# Patient Record
Sex: Male | Born: 1970 | Race: White | Hispanic: No | Marital: Married | Smoking: Never smoker
Health system: Southern US, Community
[De-identification: ages and names within clinical notes are randomized; demographics above are authoritative.]

## PROBLEM LIST (undated history)

## (undated) DIAGNOSIS — D126 Benign neoplasm of colon, unspecified: Secondary | ICD-10-CM

## (undated) DIAGNOSIS — G459 Transient cerebral ischemic attack, unspecified: Secondary | ICD-10-CM

## (undated) DIAGNOSIS — K449 Diaphragmatic hernia without obstruction or gangrene: Secondary | ICD-10-CM

## (undated) DIAGNOSIS — E7800 Pure hypercholesterolemia, unspecified: Secondary | ICD-10-CM

## (undated) DIAGNOSIS — R7303 Prediabetes: Secondary | ICD-10-CM

## (undated) DIAGNOSIS — R251 Tremor, unspecified: Secondary | ICD-10-CM

## (undated) HISTORY — DX: Prediabetes: R73.03

## (undated) HISTORY — DX: Transient cerebral ischemic attack, unspecified: G45.9

## (undated) HISTORY — DX: Pure hypercholesterolemia, unspecified: E78.00

## (undated) HISTORY — DX: Benign neoplasm of colon, unspecified: D12.6

## (undated) HISTORY — DX: Diaphragmatic hernia without obstruction or gangrene: K44.9

---

## 1992-11-19 HISTORY — PX: INGUINAL HERNIA REPAIR: SUR1180

## 2008-04-29 ENCOUNTER — Ambulatory Visit: Payer: Self-pay | Admitting: *Deleted

## 2011-04-03 NOTE — Consult Note (Signed)
VASCULAR SURGERY CONSULTATION   Jacobs, Thomas  DOB:  Oct 28, 1971                                       04/29/2008  GNFAO#:[id number]   REFERRING PHYSICIAN:  Roxanne Mins, PA-C   REASON FOR CONSULTATION:  Left upper extremity pain.   HISTORY:  The patient is a 40 year old male who describes a long history  of left upper extremity discomfort.  He dates this back to a left elbow  dislocation which occurred approximately 6 years ago.  He now describes  a heaviness and discomfort throughout his left arm intermittently.  This  is also associated with some clumsiness in his left hand.  Occasional  tingling in his fingers.  Notes an occasional tremor in his left hand.   PAST MEDICAL HISTORY:  Unremarkable.   MEDICATIONS:  None.   ALLERGIES:  None.   FAMILY HISTORY:  Noncontributory.   SOCIAL HISTORY:  The patient is married with two children.  He works as  a Curator.  Does not smoke or use alcohol products.   REVIEW OF SYSTEMS:  Negative per patient encounter form.   PHYSICAL EXAM:  Generally well-appearing 40 year old male.  Alert,  oriented.  No acute distress.  Vital Signs:  BP 101/66, pulse 59 per  minute.  Extremities:  2+ axillary, brachial, radial, and ulnar pulses  bilaterally.  No subclavian bruits.  Equal strength bilaterally.   Mild reduction in left radial pulse with military maneuver and head  turned to the right.   No tenderness over the brachial plexus.  No tenderness over the carpal  tunnel.   IMPRESSION:  Left arm pain and fatigue.   RECOMMENDATIONS:  Symptoms are not classic for thoracic outlet syndrome.  May have a post traumatic left arm nerve injury or carpal tunnel.  Referred to hand surgeons Sypher/Kuzma/Weingold for opinion.   Balinda Quails, M.D.  Electronically Signed  [facility]/MEDQ  D:  04/29/2008  T:  04/30/2008  Job:  [id number]

## 2016-01-09 ENCOUNTER — Emergency Department (HOSPITAL_COMMUNITY): Payer: Managed Care, Other (non HMO)

## 2016-01-09 ENCOUNTER — Observation Stay (HOSPITAL_COMMUNITY): Payer: Managed Care, Other (non HMO)

## 2016-01-09 ENCOUNTER — Observation Stay (HOSPITAL_COMMUNITY)
Admission: EM | Admit: 2016-01-09 | Discharge: 2016-01-11 | Disposition: A | Discharge status: Home / Self Care | Payer: Managed Care, Other (non HMO) | Attending: Internal Medicine | Admitting: Internal Medicine

## 2016-01-09 ENCOUNTER — Encounter (HOSPITAL_COMMUNITY): Payer: Self-pay

## 2016-01-09 DIAGNOSIS — G459 Transient cerebral ischemic attack, unspecified: Principal | ICD-10-CM | POA: Diagnosis present

## 2016-01-09 DIAGNOSIS — R258 Other abnormal involuntary movements: Secondary | ICD-10-CM

## 2016-01-09 DIAGNOSIS — I429 Cardiomyopathy, unspecified: Secondary | ICD-10-CM | POA: Insufficient documentation

## 2016-01-09 DIAGNOSIS — E785 Hyperlipidemia, unspecified: Secondary | ICD-10-CM | POA: Diagnosis not present

## 2016-01-09 DIAGNOSIS — R05 Cough: Secondary | ICD-10-CM

## 2016-01-09 DIAGNOSIS — R251 Tremor, unspecified: Secondary | ICD-10-CM | POA: Insufficient documentation

## 2016-01-09 DIAGNOSIS — I5022 Chronic systolic (congestive) heart failure: Secondary | ICD-10-CM | POA: Diagnosis not present

## 2016-01-09 DIAGNOSIS — R059 Cough, unspecified: Secondary | ICD-10-CM | POA: Insufficient documentation

## 2016-01-09 DIAGNOSIS — R200 Anesthesia of skin: Secondary | ICD-10-CM | POA: Diagnosis present

## 2016-01-09 DIAGNOSIS — I428 Other cardiomyopathies: Secondary | ICD-10-CM | POA: Diagnosis present

## 2016-01-09 HISTORY — DX: Tremor, unspecified: R25.1

## 2016-01-09 LAB — DIFFERENTIAL
BASOS ABS: 0 10*3/uL (ref 0.0–0.1)
Basophils Relative: 0 %
EOS ABS: 0 10*3/uL (ref 0.0–0.7)
Eosinophils Relative: 1 %
LYMPHS ABS: 1.7 10*3/uL (ref 0.7–4.0)
Lymphocytes Relative: 31 %
Monocytes Absolute: 0.3 10*3/uL (ref 0.1–1.0)
Monocytes Relative: 6 %
NEUTROS PCT: 62 %
Neutro Abs: 3.4 10*3/uL (ref 1.7–7.7)

## 2016-01-09 LAB — COMPREHENSIVE METABOLIC PANEL
ALBUMIN: 4.5 g/dL (ref 3.5–5.0)
ALT: 29 U/L (ref 17–63)
AST: 26 U/L (ref 15–41)
Alkaline Phosphatase: 60 U/L (ref 38–126)
Anion gap: 11 (ref 5–15)
BILIRUBIN TOTAL: 0.7 mg/dL (ref 0.3–1.2)
BUN: 17 mg/dL (ref 6–20)
CHLORIDE: 107 mmol/L (ref 101–111)
CO2: 24 mmol/L (ref 22–32)
Calcium: 9.7 mg/dL (ref 8.9–10.3)
Creatinine, Ser: 0.99 mg/dL (ref 0.61–1.24)
GFR calc Af Amer: >60 mL/min (ref >60)
GFR calc non Af Amer: >60 mL/min (ref >60)
GLUCOSE: 106 mg/dL — AB (ref 65–99)
POTASSIUM: 4.4 mmol/L (ref 3.5–5.1)
SODIUM: 142 mmol/L (ref 135–145)
Total Protein: 7.3 g/dL (ref 6.5–8.1)

## 2016-01-09 LAB — APTT: APTT: 33 s (ref 24–37)

## 2016-01-09 LAB — CBC
HCT: 43.4 % (ref 39.0–52.0)
HEMOGLOBIN: 14.3 g/dL (ref 13.0–17.0)
MCH: 28.8 pg (ref 26.0–34.0)
MCHC: 32.9 g/dL (ref 30.0–36.0)
MCV: 87.3 fL (ref 78.0–100.0)
Platelets: 210 10*3/uL (ref 150–400)
RBC: 4.97 MIL/uL (ref 4.22–5.81)
RDW: 12.8 % (ref 11.5–15.5)
WBC: 5.4 10*3/uL (ref 4.0–10.5)

## 2016-01-09 LAB — I-STAT CHEM 8, ED
BUN: 20 mg/dL (ref 6–20)
CHLORIDE: 103 mmol/L (ref 101–111)
CREATININE: 1 mg/dL (ref 0.61–1.24)
Calcium, Ion: 1.19 mmol/L (ref 1.12–1.23)
Glucose, Bld: 106 mg/dL — ABNORMAL HIGH (ref 65–99)
HEMATOCRIT: 47 % (ref 39.0–52.0)
Hemoglobin: 16 g/dL (ref 13.0–17.0)
POTASSIUM: 4.3 mmol/L (ref 3.5–5.1)
Sodium: 143 mmol/L (ref 135–145)
TCO2: 25 mmol/L (ref 0–100)

## 2016-01-09 LAB — I-STAT TROPONIN, ED: Troponin i, poc: 0 ng/mL (ref 0.00–0.08)

## 2016-01-09 LAB — PROTIME-INR
INR: 1.08 (ref 0.00–1.49)
Prothrombin Time: 14.2 seconds (ref 11.6–15.2)

## 2016-01-09 LAB — RAPID URINE DRUG SCREEN, HOSP PERFORMED
AMPHETAMINES: NOT DETECTED
BARBITURATES: NOT DETECTED
BENZODIAZEPINES: NOT DETECTED
COCAINE: NOT DETECTED
Opiates: NOT DETECTED
Tetrahydrocannabinol: NOT DETECTED

## 2016-01-09 MED ORDER — IOHEXOL 350 MG/ML SOLN
50.0000 mL | Freq: Once | INTRAVENOUS | Status: AC | PRN
  Administered 2016-01-09: 50 mL via INTRAVENOUS

## 2016-01-09 MED ORDER — ENOXAPARIN SODIUM 40 MG/0.4ML ~~LOC~~ SOLN
40.0000 mg | SUBCUTANEOUS | Status: DC
  Administered 2016-01-09 – 2016-01-10 (×2): 40 mg via SUBCUTANEOUS
  Filled 2016-01-09 (×2): qty 0.4

## 2016-01-09 MED ORDER — ALBUTEROL SULFATE (2.5 MG/3ML) 0.083% IN NEBU
2.5000 mg | INHALATION_SOLUTION | RESPIRATORY_TRACT | Status: DC | PRN
Start: 2016-01-09 — End: 2016-01-11

## 2016-01-09 MED ORDER — ASPIRIN 81 MG PO CHEW
81.0000 mg | CHEWABLE_TABLET | Freq: Every day | ORAL | Status: DC
  Administered 2016-01-09 – 2016-01-11 (×3): 81 mg via ORAL
  Filled 2016-01-09 (×3): qty 1

## 2016-01-09 MED ORDER — SODIUM CHLORIDE 0.9 % IV SOLN: INTRAVENOUS | Status: AC

## 2016-01-09 MED ORDER — ASPIRIN 81 MG PO CHEW: 81.0000 mg | CHEWABLE_TABLET | Freq: Every day | ORAL | Status: DC

## 2016-01-09 MED ORDER — STROKE: EARLY STAGES OF RECOVERY BOOK
Freq: Once | Status: DC
  Filled 2016-01-09: qty 1

## 2016-01-09 MED ORDER — GI COCKTAIL ~~LOC~~
30.0000 mL | Freq: Three times a day (TID) | ORAL | Status: DC
  Filled 2016-01-09 (×6): qty 30

## 2016-01-09 MED ORDER — ONDANSETRON HCL 4 MG/2ML IJ SOLN
4.0000 mg | Freq: Four times a day (QID) | INTRAMUSCULAR | Status: DC | PRN
Start: 2016-01-09 — End: 2016-01-11

## 2016-01-09 NOTE — ED Notes (Signed)
Pt provided with urinal and made aware of need for sample

## 2016-01-09 NOTE — ED Notes (Signed)
MD at bedside. 

## 2016-01-09 NOTE — Consult Note (Signed)
Admission H&P    Chief Complaint: Transient speech difficulty as well as numbness involving right face and upper extremity.  HPI: Thomas Jacobs is an 45 y.o. male with no known medical disorder presenting following an episode of progressive speech difficulty as well as numbness involving right side of his face and right upper extremity distally. Patient developed a mild headache, but had no other associated symptoms. In terms initially started with speech output difficulty which became progressively worse progressed to answer symptoms of the distal right upper extremity and face over 15-20 minutes. Deficits subsequently resolved completely. There was no focal weakness of right extremities. He has no previous history of symptoms of this nature. CT scan of his head showed no acute intracranial abnormality. MRI of the brain showed no signs of an acute stroke nor acute abnormality otherwise CT angiogram of the head and neck was unremarkable.  LSN:  tPA Given: No: Deficits rapidly resolved mRankin:  Past Medical History  Diagnosis Date  . Occasional tremors     History reviewed. No pertinent past surgical history.  Family history: Obtained and was noncontributory.  Social History:  reports that he has never smoked. He does not have any smokeless tobacco history on file. His alcohol and drug histories are not on file.  Allergies: No Known Allergies  No prescriptions prior to admission    ROS: History obtained from the patient  General ROS: negative for - chills, fatigue, fever, night sweats, weight gain or weight loss Psychological ROS: negative for - behavioral disorder, hallucinations, memory difficulties, mood swings or suicidal ideation Ophthalmic ROS: negative for - blurry vision, double vision, eye pain or loss of vision ENT ROS: negative for - epistaxis, nasal discharge, oral lesions, sore throat, tinnitus or vertigo Allergy and Immunology ROS: negative for - hives or  itchy/watery eyes Hematological and Lymphatic ROS: negative for - bleeding problems, bruising or swollen lymph nodes Endocrine ROS: negative for - galactorrhea, hair pattern changes, polydipsia/polyuria or temperature intolerance Respiratory ROS: negative for - cough, hemoptysis, shortness of breath or wheezing Cardiovascular ROS: negative for - chest pain, dyspnea on exertion, edema or irregular heartbeat Gastrointestinal ROS: negative for - abdominal pain, diarrhea, hematemesis, nausea/vomiting or stool incontinence Genito-Urinary ROS: negative for - dysuria, hematuria, incontinence or urinary frequency/urgency Musculoskeletal ROS: negative for - joint swelling or muscular weakness Neurological ROS: as noted in HPI Dermatological ROS: negative for rash and skin lesion changes  Physical Examination: Blood pressure 115/77, pulse 83, temperature 98.3 F (36.8 C), temperature source Oral, resp. rate 16, height _0  (1.803 m), weight 80.876 kg (178 lb 4.8 oz), SpO2 96 %.  HEENT-  Normocephalic, no lesions, without obvious abnormality.  Normal external eye and conjunctiva.  Normal TM's bilaterally.  Normal auditory canals and external ears. Normal external nose, mucus membranes and septum.  Normal pharynx. Neck supple with no masses, nodes, nodules or enlargement. Cardiovascular - regular rate and rhythm, S1, S2 normal, no murmur, click, rub or gallop Lungs - chest clear, no wheezing, rales, normal symmetric air entry Abdomen - soft, non-tender; bowel sounds normal; no masses,  no organomegaly Extremities - no joint deformities, effusion, or inflammation and no edema  Neurologic Examination: Mental Status: Alert, oriented, thought content appropriate.  Speech fluent without evidence of aphasia. Able to follow commands without difficulty. Cranial Nerves: II-Visual fields were normal. III/IV/VI-Pupils were equal and reacted normally to light. Extraocular movements were full and conjugate.     V/VII-no facial numbness and no facial weakness. VIII-normal. X-normal speech and  symmetrical palatal movement. XI: trapezius strength/neck flexion strength normal bilaterally XII-midline tongue extension with normal strength. Motor: 5/5 bilaterally with normal tone and bulk Sensory: Normal throughout. Deep Tendon Reflexes: 2+ and symmetric. Plantars: Flexor bilaterally Cerebellar: Normal finger-to-nose testing Septra mild intention tremor bilaterally. Carotid auscultation: Normal  Results for orders placed or performed during the hospital encounter of 01/09/16 (from the past 48 hour(s))  Protime-INR     Status: None   Collection Time: 01/09/16 12:15 PM  Result Value Ref Range   Prothrombin Time 14.2 11.6 - 15.2 seconds   INR 1.08 0.00 - 1.49  APTT     Status: None   Collection Time: 01/09/16 12:15 PM  Result Value Ref Range   aPTT 33 24 - 37 seconds  CBC     Status: None   Collection Time: 01/09/16 12:15 PM  Result Value Ref Range   WBC 5.4 4.0 - 10.5 K/uL   RBC 4.97 4.22 - 5.81 MIL/uL   Hemoglobin 14.3 13.0 - 17.0 g/dL   HCT 43.4 39.0 - 52.0 %   MCV 87.3 78.0 - 100.0 fL   MCH 28.8 26.0 - 34.0 pg   MCHC 32.9 30.0 - 36.0 g/dL   RDW 12.8 11.5 - 15.5 %   Platelets 210 150 - 400 K/uL  Differential     Status: None   Collection Time: 01/09/16 12:15 PM  Result Value Ref Range   Neutrophils Relative % 62 %   Neutro Abs 3.4 1.7 - 7.7 K/uL   Lymphocytes Relative 31 %   Lymphs Abs 1.7 0.7 - 4.0 K/uL   Monocytes Relative 6 %   Monocytes Absolute 0.3 0.1 - 1.0 K/uL   Eosinophils Relative 1 %   Eosinophils Absolute 0.0 0.0 - 0.7 K/uL   Basophils Relative 0 %   Basophils Absolute 0.0 0.0 - 0.1 K/uL  Comprehensive metabolic panel     Status: Abnormal   Collection Time: 01/09/16 12:15 PM  Result Value Ref Range   Sodium 142 135 - 145 mmol/L   Potassium 4.4 3.5 - 5.1 mmol/L   Chloride 107 101 - 111 mmol/L   CO2 24 22 - 32 mmol/L   Glucose, Bld 106 (H) 65 - 99 mg/dL   BUN 17 6  - 20 mg/dL   Creatinine, Ser 0.99 0.61 - 1.24 mg/dL   Calcium 9.7 8.9 - 10.3 mg/dL   Total Protein 7.3 6.5 - 8.1 g/dL   Albumin 4.5 3.5 - 5.0 g/dL   AST 26 15 - 41 U/L   ALT 29 17 - 63 U/L   Alkaline Phosphatase 60 38 - 126 U/L   Total Bilirubin 0.7 0.3 - 1.2 mg/dL   GFR calc non Af Amer >60 >60 mL/min   GFR calc Af Amer >60 >60 mL/min    Comment: (NOTE) The eGFR has been calculated using the CKD EPI equation. This calculation has not been validated in all clinical situations. eGFR's persistently <60 mL/min signify possible Chronic Kidney Disease.    Anion gap 11 5 - 15  I-stat troponin, ED (not at [facility], [facility])     Status: None   Collection Time: 01/09/16 12:33 PM  Result Value Ref Range   Troponin i, poc 0.00 0.00 - 0.08 ng/mL   Comment 3            Comment: Due to the release kinetics of cTnI, a negative result within the first hours of the onset of symptoms does not rule out myocardial infarction with certainty.  If myocardial infarction is still suspected, repeat the test at appropriate intervals.   I-Stat Chem 8, ED  (not at [facility], [facility])     Status: Abnormal   Collection Time: 01/09/16 12:34 PM  Result Value Ref Range   Sodium 143 135 - 145 mmol/L   Potassium 4.3 3.5 - 5.1 mmol/L   Chloride 103 101 - 111 mmol/L   BUN 20 6 - 20 mg/dL   Creatinine, Ser 1.00 0.61 - 1.24 mg/dL   Glucose, Bld 106 (H) 65 - 99 mg/dL   Calcium, Ion 1.19 1.12 - 1.23 mmol/L   TCO2 25 0 - 100 mmol/L   Hemoglobin 16.0 13.0 - 17.0 g/dL   HCT 47.0 39.0 - 52.0 %  Urine rapid drug screen (hosp performed)     Status: None   Collection Time: 01/09/16  7:35 PM  Result Value Ref Range   Opiates NONE DETECTED NONE DETECTED   Cocaine NONE DETECTED NONE DETECTED   Benzodiazepines NONE DETECTED NONE DETECTED   Amphetamines NONE DETECTED NONE DETECTED   Tetrahydrocannabinol NONE DETECTED NONE DETECTED   Barbiturates NONE DETECTED NONE DETECTED    Comment:        DRUG SCREEN FOR MEDICAL PURPOSES ONLY.  IF  CONFIRMATION IS NEEDED FOR ANY PURPOSE, NOTIFY LAB WITHIN 5 DAYS.        LOWEST DETECTABLE LIMITS FOR URINE DRUG SCREEN Drug Class       Cutoff (ng/mL) Amphetamine      1000 Barbiturate      200 Benzodiazepine   226 Tricyclics       333 Opiates          300 Cocaine          300 THC              50    Ct Angio Head W/cm &/or Wo Cm  01/09/2016  CLINICAL DATA:  45 year old male with 30 minutes of difficulty speaking and right face and upper extremity numbness, since resolved. Subsequent headache. TIA. Initial encounter. EXAM: CT ANGIOGRAPHY HEAD AND NECK TECHNIQUE: Multidetector CT imaging of the head and neck was performed using the standard protocol during bolus administration of intravenous contrast. Multiplanar CT image reconstructions and MIPs were obtained to evaluate the vascular anatomy. Carotid stenosis measurements (when applicable) are obtained utilizing NASCET criteria, using the distal internal carotid diameter as the denominator. CONTRAST:  8m OMNIPAQUE IOHEXOL 350 MG/ML SOLN COMPARISON:  Head CT without contrast 1230 hours today. FINDINGS: CTA NECK Skeleton: No acute osseous abnormality identified. Right greater than left maxillary sinus mucosal thickening and/or fluid. Other neck: Negative lung apices. No superior mediastinal lymphadenopathy. Negative thyroid, larynx, pharynx (lingual tonsil hypertrophy), parapharyngeal spaces, retropharyngeal space, sublingual space, submandibular glands, and parotid glands. No cervical lymphadenopathy. Visualized orbits and scalp soft tissues are within normal limits. Aortic arch: 3 vessel arch configuration with no arch atherosclerosis and normal great vessel origins. Right carotid system: Negative aside from tortuosity of the right ICA just below the skullbase. No cervical right carotid atherosclerosis. Left carotid system: Negative aside from left ICA tortuosity just below the skullbase. No cervical left carotid atherosclerosis. Vertebral  arteries:No proximal subclavian artery stenosis. Both vertebral artery origins are normal. Both vertebral arteries are negative to the skullbase. CTA HEAD Posterior circulation: Mildly dominant distal right vertebral artery. Both PICA origins occur somewhat early (as the vessels are crossing the dura) but otherwise appear normal. Patent vertebrobasilar junction. No basilar artery stenosis. Normal SCA and PCA origins. Left posterior communicating  artery is present, the right is diminutive or absent. Bilateral PCA branches are within normal limits. Anterior circulation: Both ICA siphons are patent and within normal limits. No siphon atherosclerosis. Ophthalmic and left posterior communicating artery origins appear normal. Patent carotid termini. Normal MCA and ACA origins. There is mild venous contamination about the carotid termini. Anterior communicating artery and bilateral ACA branches are within normal limits. Left MCA M1 segment, bifurcation, and left MCA branches are within normal limits. Right MCA M1 segment, bifurcation, and right MCA branches are within normal limits. Venous sinuses: Patent. Anatomic variants: None. Delayed phase: No abnormal enhancement identified. Stable and negative CT appearance of the brain. IMPRESSION: 1. Negative CTA head and neck other than bilateral ICA tortuosity just below the skullbase. 2. Stable and negative CT appearance of the brain. 3. No acute findings in the neck. Electronically Signed   By: Genevie Ann M.D.   On: 01/09/2016 19:19   Dg Chest 2 View  01/09/2016  CLINICAL DATA:  45 year old with a possible TIA earlier today as evidenced by an episode of dysarthria while at work associated with right arm numbness. EXAM: CHEST  2 VIEW COMPARISON:  None. FINDINGS: Cardiomediastinal silhouette unremarkable. Lungs clear. Bronchovascular markings normal. Pulmonary vascularity normal. No pneumothorax. No pleural effusions. Visualized bony thorax intact. IMPRESSION: Normal  examination. Electronically Signed   By: Evangeline Dakin M.D.   On: 01/09/2016 18:52   Ct Head Wo Contrast  01/09/2016  CLINICAL DATA:  Acute onset slurred speech. Right arm facial numbness today. Headache. EXAM: CT HEAD WITHOUT CONTRAST TECHNIQUE: Contiguous axial images were obtained from the base of the skull through the vertex without intravenous contrast. COMPARISON:  None. FINDINGS: No evidence of intracranial hemorrhage, brain edema, or other signs of acute infarction. No evidence of intracranial mass lesion or mass effect. No abnormal extraaxial fluid collections identified. Ventricles are normal in size. No skull abnormality identified. Mild mucosal thickening seen involving the right maxillary sinus. IMPRESSION: No acute intracranial abnormality. Mild chronic right maxillary sinusitis. Electronically Signed   By: Earle Gell M.D.   On: 01/09/2016 13:00   Ct Angio Neck W/cm &/or Wo/cm  01/09/2016  CLINICAL DATA:  45 year old male with 30 minutes of difficulty speaking and right face and upper extremity numbness, since resolved. Subsequent headache. TIA. Initial encounter. EXAM: CT ANGIOGRAPHY HEAD AND NECK TECHNIQUE: Multidetector CT imaging of the head and neck was performed using the standard protocol during bolus administration of intravenous contrast. Multiplanar CT image reconstructions and MIPs were obtained to evaluate the vascular anatomy. Carotid stenosis measurements (when applicable) are obtained utilizing NASCET criteria, using the distal internal carotid diameter as the denominator. CONTRAST:  25m OMNIPAQUE IOHEXOL 350 MG/ML SOLN COMPARISON:  Head CT without contrast 1230 hours today. FINDINGS: CTA NECK Skeleton: No acute osseous abnormality identified. Right greater than left maxillary sinus mucosal thickening and/or fluid. Other neck: Negative lung apices. No superior mediastinal lymphadenopathy. Negative thyroid, larynx, pharynx (lingual tonsil hypertrophy), parapharyngeal spaces,  retropharyngeal space, sublingual space, submandibular glands, and parotid glands. No cervical lymphadenopathy. Visualized orbits and scalp soft tissues are within normal limits. Aortic arch: 3 vessel arch configuration with no arch atherosclerosis and normal great vessel origins. Right carotid system: Negative aside from tortuosity of the right ICA just below the skullbase. No cervical right carotid atherosclerosis. Left carotid system: Negative aside from left ICA tortuosity just below the skullbase. No cervical left carotid atherosclerosis. Vertebral arteries:No proximal subclavian artery stenosis. Both vertebral artery origins are normal. Both vertebral arteries are  negative to the skullbase. CTA HEAD Posterior circulation: Mildly dominant distal right vertebral artery. Both PICA origins occur somewhat early (as the vessels are crossing the dura) but otherwise appear normal. Patent vertebrobasilar junction. No basilar artery stenosis. Normal SCA and PCA origins. Left posterior communicating artery is present, the right is diminutive or absent. Bilateral PCA branches are within normal limits. Anterior circulation: Both ICA siphons are patent and within normal limits. No siphon atherosclerosis. Ophthalmic and left posterior communicating artery origins appear normal. Patent carotid termini. Normal MCA and ACA origins. There is mild venous contamination about the carotid termini. Anterior communicating artery and bilateral ACA branches are within normal limits. Left MCA M1 segment, bifurcation, and left MCA branches are within normal limits. Right MCA M1 segment, bifurcation, and right MCA branches are within normal limits. Venous sinuses: Patent. Anatomic variants: None. Delayed phase: No abnormal enhancement identified. Stable and negative CT appearance of the brain. IMPRESSION: 1. Negative CTA head and neck other than bilateral ICA tortuosity just below the skullbase. 2. Stable and negative CT appearance of the  brain. 3. No acute findings in the neck. Electronically Signed   By: Genevie Ann M.D.   On: 01/09/2016 19:19   Mr Brain Wo Contrast  01/09/2016  CLINICAL DATA:  Initial evaluation for acute fell of expressive aphasia, numbness and tingling on right side of face and right hand. Now resolved. EXAM: MRI HEAD WITHOUT CONTRAST TECHNIQUE: Multiplanar, multiecho pulse sequences of the brain and surrounding structures were obtained without intravenous contrast. COMPARISON:  Prior CT and CTA from earlier the same day. FINDINGS: Cerebral volume within normal limits for patient age. No focal parenchymal signal abnormality identified. No significant white matter disease. No abnormal foci of restricted diffusion to suggest acute intracranial infarct. Gray-white matter differentiation well maintained. Major intracranial vascular flow voids are preserved. No acute or chronic intracranial hemorrhage. No areas of chronic infarction. No mass lesion, midline shift, or mass effect. No hydrocephalus. No extra-axial fluid collection. Major dural sinuses are patent. Cerebellar tonsils are low lying positioned approximately 9 mm below the foramen magnum, consistent with Chiari 1 malformation. No associated syrinx or other abnormality. Pituitary gland normal.  No acute abnormality about the orbits. Mild mucosal thickening within the bilateral maxillary sinuses. Air-fluid level present within the right maxillary sinus. Minimal mucosal thickening within the ethmoidal air cells as well. Small right mastoid effusion noted, likely benign. Trace opacity within the left mastoid air cells. Inner ear structures grossly normal. Bone marrow signal intensity within normal limits. No scalp soft tissue abnormality. IMPRESSION: 1. No acute intracranial infarct or other process identified. 2. 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 3. Otherwise normal brain MRI. 4. Acute right maxillary sinusitis.  Electronically Signed   By: Jeannine Boga M.D.   On: 01/09/2016 20:23    Assessment: 45 y.o. male with no risk factors for stroke presenting with transient speech abnormality and sensory changes involving right face and upper extremity vaginally developed over a period of 15-20 minutes and subsequently resolving, likely manifestations of migraine phenomenon. TIA secondary to embolic or thrombotic phenomena is less likely but cannot be completely ruled out at this point.  Stroke Risk Factors - none  Plan: 1. HgbA1c, fasting lipid panel 2. MRI, MRA  of the brain without contrast 3. PT consult, OT consult, Speech consult 4. Echocardiogram 5. Carotid dopplers 6. Prophylactic therapy-Antiplatelet med: Aspirin  7. Risk factor modification 8. Telemetry monitoring 9. Hypercoagulopathy panel  C.R. Nicole Kindred,  MD [facility] [phone]  01/09/2016, 10:16 PM

## 2016-01-09 NOTE — H&P (Addendum)
Patient Demographics:    Thomas Jacobs, is a 45 y.o. male  MRN: [id number]   DOB - [birth year]  Admit Date - 01/09/2016  Outpatient Primary MD for the patient is No primary care provider on file.   With History of -  Past Medical History  Diagnosis Date  . Occasional tremors       History reviewed. No pertinent past surgical history.  in for   Chief Complaint  Patient presents with  . facial pain, arm numbness       HPI:    Thomas Jacobs  is a 45 y.o. 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  In the ER workup essentially unremarkable including normal blood work and CT head, he has no subjective complaints and his exam is unremarkable. I was called to admit the patient for TIA workup. Of note patient complains of some neck pain extending into his head for the last several months, he contributes this to his posture at work. He has had a mild cough x 2 weeks.     Review of systems:    In addition to the HPI above, currently negative No Fever-chills, No Headache, No changes with Vision or hearing, No problems swallowing food or Liquids, No Chest pain, +ve Cough but no Shortness of Breath, No Abdominal pain, No Nausea or Vommitting, Bowel movements are regular, No Blood in stool or Urine, No dysuria, No new skin rashes or bruises, No new joints  pains-aches,  No new weakness, tingling, numbness in any extremity, No recent weight gain or loss, No polyuria, polydypsia or polyphagia, No significant Mental Stressors.  A full 10 point Review of Systems was done, except as stated above, all other Review of Systems were negative.    Social History:     Social History  Substance Use Topics  . Smoking status: Never Smoker   . Smokeless tobacco: Not on file  . Alcohol Use: Not on file    Lives - at home with his wife, active and working   Family History :   No history of CVA   Home Medications:   Prior to Admission medications   Not on File     Allergies:    No Known Allergies   Physical Exam:   Vitals  Blood pressure 123/84, pulse 74, temperature 97.9 F (36.6 C), temperature source Oral, resp. rate 14, height 5\' 11"  (1.803 m), weight 80.74 kg (178 lb),  SpO2 99 %.   1. General middle aged white male lying in bed in NAD,     2. Normal affect and insight, Not Suicidal or Homicidal, Awake Alert, Oriented X 3.  3. No F.N deficits, ALL C.Nerves Intact, Strength 5/5 all 4 extremities, Sensation intact all 4 extremities, Plantars down going.  4. Ears and Eyes appear Normal, Conjunctivae clear, PERRLA. Moist Oral Mucosa.  5. Supple Neck, No JVD, No cervical lymphadenopathy appriciated, No Carotid Bruits.  6. Symmetrical Chest wall movement, Good air movement bilaterally, CTAB.  7. RRR, No Gallops, Rubs or Murmurs, No Parasternal Heave.  8. Positive Bowel Sounds, Abdomen Soft, No tenderness, No organomegaly appriciated,No rebound -guarding or rigidity.  9.  No Cyanosis, Normal Skin Turgor, No Skin Rash or Bruise.  10. Good muscle tone,  joints appear normal , no effusions, Normal ROM.  11. No Palpable Lymph Nodes in Neck or Axillae      Data Review:    CBC  Recent Labs Lab 01/09/16 1215 01/09/16 1234  WBC 5.4  --   HGB 14.3 16.0  HCT 43.4 47.0  PLT 210  --   MCV 87.3  --   MCH 28.8  --   MCHC  32.9  --   RDW 12.8  --   LYMPHSABS 1.7  --   MONOABS 0.3  --   EOSABS 0.0  --   BASOSABS 0.0  --    ------------------------------------------------------------------------------------------------------------------  Chemistries   Recent Labs Lab 01/09/16 1215 01/09/16 1234  NA 142 143  K 4.4 4.3  CL 107 103  CO2 24  --   GLUCOSE 106* 106*  BUN 17 20  CREATININE 0.99 1.00  CALCIUM 9.7  --   AST 26  --   ALT 29  --   ALKPHOS 60  --   BILITOT 0.7  --    ------------------------------------------------------------------------------------------------------------------ estimated creatinine clearance is 99.4 mL/min (by C-G formula based on Cr of 1). ------------------------------------------------------------------------------------------------------------------ No results for input(s): TSH, T4TOTAL, T3FREE, THYROIDAB in the last 72 hours.  Invalid input(s): FREET3   Coagulation profile  Recent Labs Lab 01/09/16 1215  INR 1.08   ------------------------------------------------------------------------------------------------------------------- No results for input(s): DDIMER in the last 72 hours. -------------------------------------------------------------------------------------------------------------------  Cardiac Enzymes No results for input(s): CKMB, TROPONINI, MYOGLOBIN in the last 168 hours.  Invalid input(s): CK ------------------------------------------------------------------------------------------------------------------ No results found for: BNP   ---------------------------------------------------------------------------------------------------------------  Urinalysis No results found for: COLORURINE, APPEARANCEUR, LABSPEC, PHURINE, GLUCOSEU, HGBUR, BILIRUBINUR, KETONESUR, PROTEINUR, UROBILINOGEN, NITRITE, LEUKOCYTESUR  ----------------------------------------------------------------------------------------------------------------   Imaging  Results:    Ct Head Wo Contrast  01/09/2016  CLINICAL DATA:  Acute onset slurred speech. Right arm facial numbness today. Headache. EXAM: CT HEAD WITHOUT CONTRAST TECHNIQUE: Contiguous axial images were obtained from the base of the skull through the vertex without intravenous contrast. COMPARISON:  None. FINDINGS: No evidence of intracranial hemorrhage, brain edema, or other signs of acute infarction. No evidence of intracranial mass lesion or mass effect. No abnormal extraaxial fluid collections identified. Ventricles are normal in size. No skull abnormality identified. Mild mucosal thickening seen involving the right maxillary sinus. IMPRESSION: No acute intracranial abnormality. Mild chronic right maxillary sinusitis. Electronically Signed   By: Earle Gell M.D.   On: 01/09/2016 13:00    My personal review of EKG: Rhythm NSR,  no Acute ST changes   Assessment & Plan:      1. TIA. Symptoms have all completely resolved, head CT unremarkable, full stroke workup will be initiated which will include MRI brain, CT  angiogram head and neck, echogram, A1c-lipid panel, PT-OT and speech evaluation. He will be placed on aspirin, neurology will evaluate the patient as well. Check urine drug screen.  2. Chronic neck pain with mild intermittent headaches. Check CT angiogram head and neck.  3. Mild ongoing cough - check CXR.    DVT Prophylaxis  Lovenox   AM Labs Ordered, also please review Full Orders  Family Communication: Admission, patients condition and plan of care including tests being ordered have been discussed with the patient and wife who indicate understanding and agree with the plan and Code Status.  Code Status Full  Likely DC to  Home in am  Condition Fair  Time spent in minutes : 30    Sua Spadafora K M.D on 01/09/2016 at 5:56 PM  Between 7am to 7pm - Pager - [phone]  After 7pm go to [email or web address] - password [facility]  [facility] - Office  [phone]
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
## 2016-01-09 NOTE — ED Notes (Signed)
Pt continues to be in Radiology.    Attempted report x 1

## 2016-01-09 NOTE — ED Notes (Signed)
Pt gait steady and even to room with RN.  Pt changing independently at this time.

## 2016-01-09 NOTE — ED Notes (Signed)
Patient here by EMS after having trouble articulating his words while at work and had some right arm numbness. On arrival those symptoms have resolved but now complains of left sided facial pain and posterior neck stiffness. Alert and oriented. No neuro deficits noted on assessment

## 2016-01-09 NOTE — ED Notes (Signed)
Admitting at bedside 

## 2016-01-10 ENCOUNTER — Observation Stay (HOSPITAL_BASED_OUTPATIENT_CLINIC_OR_DEPARTMENT_OTHER): Payer: Managed Care, Other (non HMO)

## 2016-01-10 DIAGNOSIS — E785 Hyperlipidemia, unspecified: Secondary | ICD-10-CM | POA: Diagnosis not present

## 2016-01-10 DIAGNOSIS — G451 Carotid artery syndrome (hemispheric): Secondary | ICD-10-CM | POA: Diagnosis not present

## 2016-01-10 DIAGNOSIS — R258 Other abnormal involuntary movements: Secondary | ICD-10-CM | POA: Diagnosis not present

## 2016-01-10 DIAGNOSIS — I340 Nonrheumatic mitral (valve) insufficiency: Secondary | ICD-10-CM

## 2016-01-10 LAB — LIPID PANEL
CHOL/HDL RATIO: 6.6 ratio
CHOLESTEROL: 177 mg/dL (ref 0–200)
HDL: 27 mg/dL — ABNORMAL LOW (ref >40)
LDL Cholesterol: 120 mg/dL — ABNORMAL HIGH (ref 0–99)
Triglycerides: 149 mg/dL (ref <150)
VLDL: 30 mg/dL (ref 0–40)

## 2016-01-10 MED ORDER — ATORVASTATIN CALCIUM 20 MG PO TABS: 20.0000 mg | ORAL_TABLET | Freq: Every day | ORAL | Status: DC

## 2016-01-10 MED ORDER — ASPIRIN 81 MG PO CHEW: 81.0000 mg | CHEWABLE_TABLET | Freq: Every day | ORAL | Status: DC

## 2016-01-10 MED ORDER — ATORVASTATIN CALCIUM 10 MG PO TABS
20.0000 mg | ORAL_TABLET | Freq: Every day | ORAL | Status: DC
  Administered 2016-01-10: 20 mg via ORAL
  Filled 2016-01-10: qty 2

## 2016-01-10 NOTE — Progress Notes (Signed)
STROKE TEAM PROGRESS NOTE   HISTORY OF PRESENT ILLNESS Thomas Jacobs is an 45 y.o. male with no known medical disorder presenting following an episode of progressive speech difficulty as well as numbness involving right side of his face and right upper extremity distally. Patient developed a mild headache, but had no other associated symptoms. In terms initially started with speech output difficulty which became progressively worse progressed to answer symptoms of the distal right upper extremity and face over 15-20 minutes. Deficits subsequently resolved completely. There was no focal weakness of right extremities. He has no previous history of symptoms of this nature. CT scan of his head showed no acute intracranial abnormality. MRI of the brain showed no signs of an acute stroke nor acute abnormality otherwise CT angiogram of the head and neck was unremarkable. Patient was not administered TPA secondary to Deficits rapidly resolved. He was admitted for further evaluation and treatment.   SUBJECTIVE (INTERVAL HISTORY) His  Mother and friends are  at the bedside.  Overall he feels his condition is completely resolved.     OBJECTIVE Temp:  [97.8 F (36.6 C)-98.5 F (36.9 C)] 97.8 F (36.6 C) (02/21 0600) Pulse Rate:  [63-84] 63 (02/21 0600) Cardiac Rhythm:  [-] Normal sinus rhythm (02/20 2029) Resp:  [14-20] 18 (02/21 0600) BP: (100-132)/(59-92) 100/59 mmHg (02/21 0600) SpO2:  [92 %-100 %] 98 % (02/21 0600) Weight:  [80.74 kg (178 lb)-80.876 kg (178 lb 4.8 oz)] 80.876 kg (178 lb 4.8 oz) (02/20 2028)  CBC:  Recent Labs Lab 01/09/16 1215 01/09/16 1234  WBC 5.4  --   NEUTROABS 3.4  --   HGB 14.3 16.0  HCT 43.4 47.0  MCV 87.3  --   PLT 210  --     Basic Metabolic Panel:  Recent Labs Lab 01/09/16 1215 01/09/16 1234  NA 142 143  K 4.4 4.3  CL 107 103  CO2 24  --   GLUCOSE 106* 106*  BUN 17 20  CREATININE 0.99 1.00  CALCIUM 9.7  --     Lipid Panel:    Component Value  Date/Time   CHOL 177 01/10/2016 0539   TRIG 149 01/10/2016 0539   HDL 27* 01/10/2016 0539   CHOLHDL 6.6 01/10/2016 0539   VLDL 30 01/10/2016 0539   LDLCALC 120* 01/10/2016 0539   HgbA1c: No results found for: HGBA1C Urine Drug Screen:    Component Value Date/Time   LABOPIA NONE DETECTED 01/09/2016 1935   COCAINSCRNUR NONE DETECTED 01/09/2016 1935   LABBENZ NONE DETECTED 01/09/2016 1935   AMPHETMU NONE DETECTED 01/09/2016 1935   THCU NONE DETECTED 01/09/2016 1935   LABBARB NONE DETECTED 01/09/2016 1935      IMAGING  Ct Angio Head W/cm &/or Wo Cm  01/09/2016  CLINICAL DATA:  45 year old male with 30 minutes of difficulty speaking and right face and upper extremity numbness, since resolved. Subsequent headache. TIA. Initial encounter. EXAM: CT ANGIOGRAPHY HEAD AND NECK TECHNIQUE: Multidetector CT imaging of the head and neck was performed using the standard protocol during bolus administration of intravenous contrast. Multiplanar CT image reconstructions and MIPs were obtained to evaluate the vascular anatomy. Carotid stenosis measurements (when applicable) are obtained utilizing NASCET criteria, using the distal internal carotid diameter as the denominator. CONTRAST:  46mL OMNIPAQUE IOHEXOL 350 MG/ML SOLN COMPARISON:  Head CT without contrast 1230 hours today. FINDINGS: CTA NECK Skeleton: No acute osseous abnormality identified. Right greater than left maxillary sinus mucosal thickening and/or fluid. Other neck: Negative lung apices. No superior  mediastinal lymphadenopathy. Negative thyroid, larynx, pharynx (lingual tonsil hypertrophy), parapharyngeal spaces, retropharyngeal space, sublingual space, submandibular glands, and parotid glands. No cervical lymphadenopathy. Visualized orbits and scalp soft tissues are within normal limits. Aortic arch: 3 vessel arch configuration with no arch atherosclerosis and normal great vessel origins. Right carotid system: Negative aside from tortuosity of the  right ICA just below the skullbase. No cervical right carotid atherosclerosis. Left carotid system: Negative aside from left ICA tortuosity just below the skullbase. No cervical left carotid atherosclerosis. Vertebral arteries:No proximal subclavian artery stenosis. Both vertebral artery origins are normal. Both vertebral arteries are negative to the skullbase. CTA HEAD Posterior circulation: Mildly dominant distal right vertebral artery. Both PICA origins occur somewhat early (as the vessels are crossing the dura) but otherwise appear normal. Patent vertebrobasilar junction. No basilar artery stenosis. Normal SCA and PCA origins. Left posterior communicating artery is present, the right is diminutive or absent. Bilateral PCA branches are within normal limits. Anterior circulation: Both ICA siphons are patent and within normal limits. No siphon atherosclerosis. Ophthalmic and left posterior communicating artery origins appear normal. Patent carotid termini. Normal MCA and ACA origins. There is mild venous contamination about the carotid termini. Anterior communicating artery and bilateral ACA branches are within normal limits. Left MCA M1 segment, bifurcation, and left MCA branches are within normal limits. Right MCA M1 segment, bifurcation, and right MCA branches are within normal limits. Venous sinuses: Patent. Anatomic variants: None. Delayed phase: No abnormal enhancement identified. Stable and negative CT appearance of the brain. IMPRESSION: 1. Negative CTA head and neck other than bilateral ICA tortuosity just below the skullbase. 2. Stable and negative CT appearance of the brain. 3. No acute findings in the neck. Electronically Signed   By: Genevie Ann M.D.   On: 01/09/2016 19:19   Dg Chest 2 View  01/09/2016  CLINICAL DATA:  45 year old with a possible TIA earlier today as evidenced by an episode of dysarthria while at work associated with right arm numbness. EXAM: CHEST  2 VIEW COMPARISON:  None. FINDINGS:  Cardiomediastinal silhouette unremarkable. Lungs clear. Bronchovascular markings normal. Pulmonary vascularity normal. No pneumothorax. No pleural effusions. Visualized bony thorax intact. IMPRESSION: Normal examination. Electronically Signed   By: Evangeline Dakin M.D.   On: 01/09/2016 18:52   Ct Head Wo Contrast  01/09/2016  CLINICAL DATA:  Acute onset slurred speech. Right arm facial numbness today. Headache. EXAM: CT HEAD WITHOUT CONTRAST TECHNIQUE: Contiguous axial images were obtained from the base of the skull through the vertex without intravenous contrast. COMPARISON:  None. FINDINGS: No evidence of intracranial hemorrhage, brain edema, or other signs of acute infarction. No evidence of intracranial mass lesion or mass effect. No abnormal extraaxial fluid collections identified. Ventricles are normal in size. No skull abnormality identified. Mild mucosal thickening seen involving the right maxillary sinus. IMPRESSION: No acute intracranial abnormality. Mild chronic right maxillary sinusitis. Electronically Signed   By: Earle Gell M.D.   On: 01/09/2016 13:00   Ct Angio Neck W/cm &/or Wo/cm  01/09/2016  CLINICAL DATA:  45 year old male with 30 minutes of difficulty speaking and right face and upper extremity numbness, since resolved. Subsequent headache. TIA. Initial encounter. EXAM: CT ANGIOGRAPHY HEAD AND NECK TECHNIQUE: Multidetector CT imaging of the head and neck was performed using the standard protocol during bolus administration of intravenous contrast. Multiplanar CT image reconstructions and MIPs were obtained to evaluate the vascular anatomy. Carotid stenosis measurements (when applicable) are obtained utilizing NASCET criteria, using the distal internal carotid diameter  as the denominator. CONTRAST:  45mL OMNIPAQUE IOHEXOL 350 MG/ML SOLN COMPARISON:  Head CT without contrast 1230 hours today. FINDINGS: CTA NECK Skeleton: No acute osseous abnormality identified. Right greater than left  maxillary sinus mucosal thickening and/or fluid. Other neck: Negative lung apices. No superior mediastinal lymphadenopathy. Negative thyroid, larynx, pharynx (lingual tonsil hypertrophy), parapharyngeal spaces, retropharyngeal space, sublingual space, submandibular glands, and parotid glands. No cervical lymphadenopathy. Visualized orbits and scalp soft tissues are within normal limits. Aortic arch: 3 vessel arch configuration with no arch atherosclerosis and normal great vessel origins. Right carotid system: Negative aside from tortuosity of the right ICA just below the skullbase. No cervical right carotid atherosclerosis. Left carotid system: Negative aside from left ICA tortuosity just below the skullbase. No cervical left carotid atherosclerosis. Vertebral arteries:No proximal subclavian artery stenosis. Both vertebral artery origins are normal. Both vertebral arteries are negative to the skullbase. CTA HEAD Posterior circulation: Mildly dominant distal right vertebral artery. Both PICA origins occur somewhat early (as the vessels are crossing the dura) but otherwise appear normal. Patent vertebrobasilar junction. No basilar artery stenosis. Normal SCA and PCA origins. Left posterior communicating artery is present, the right is diminutive or absent. Bilateral PCA branches are within normal limits. Anterior circulation: Both ICA siphons are patent and within normal limits. No siphon atherosclerosis. Ophthalmic and left posterior communicating artery origins appear normal. Patent carotid termini. Normal MCA and ACA origins. There is mild venous contamination about the carotid termini. Anterior communicating artery and bilateral ACA branches are within normal limits. Left MCA M1 segment, bifurcation, and left MCA branches are within normal limits. Right MCA M1 segment, bifurcation, and right MCA branches are within normal limits. Venous sinuses: Patent. Anatomic variants: None. Delayed phase: No abnormal  enhancement identified. Stable and negative CT appearance of the brain. IMPRESSION: 1. Negative CTA head and neck other than bilateral ICA tortuosity just below the skullbase. 2. Stable and negative CT appearance of the brain. 3. No acute findings in the neck. Electronically Signed   By: Genevie Ann M.D.   On: 01/09/2016 19:19   Mr Brain Wo Contrast  01/09/2016  CLINICAL DATA:  Initial evaluation for acute fell of expressive aphasia, numbness and tingling on right side of face and right hand. Now resolved. EXAM: MRI HEAD WITHOUT CONTRAST TECHNIQUE: Multiplanar, multiecho pulse sequences of the brain and surrounding structures were obtained without intravenous contrast. COMPARISON:  Prior CT and CTA from earlier the same day. FINDINGS: Cerebral volume within normal limits for patient age. No focal parenchymal signal abnormality identified. No significant white matter disease. No abnormal foci of restricted diffusion to suggest acute intracranial infarct. Gray-white matter differentiation well maintained. Major intracranial vascular flow voids are preserved. No acute or chronic intracranial hemorrhage. No areas of chronic infarction. No mass lesion, midline shift, or mass effect. No hydrocephalus. No extra-axial fluid collection. Major dural sinuses are patent. Cerebellar tonsils are low lying positioned approximately 9 mm below the foramen magnum, consistent with Chiari 1 malformation. No associated syrinx or other abnormality. Pituitary gland normal.  No acute abnormality about the orbits. Mild mucosal thickening within the bilateral maxillary sinuses. Air-fluid level present within the right maxillary sinus. Minimal mucosal thickening within the ethmoidal air cells as well. Small right mastoid effusion noted, likely benign. Trace opacity within the left mastoid air cells. Inner ear structures grossly normal. Bone marrow signal intensity within normal limits. No scalp soft tissue abnormality. IMPRESSION: 1. No acute  intracranial infarct or other process identified. 2. 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 3. Otherwise normal brain MRI. 4. Acute right maxillary sinusitis. Electronically Signed   By: Jeannine Boga M.D.   On: 01/09/2016 20:23       PHYSICAL EXAM Pleasant young Caucasian male not in distress. . Afebrile. Head is nontraumatic. Neck is supple without bruit.    Cardiac exam no murmur or gallop. Lungs are clear to auscultation. Distal pulses are well felt. Neurological Exam :  Awake  Alert oriented x 3. Normal speech and language.eye movements full without nystagmus.fundi were not visualized. Vision acuity and fields appear normal. Hearing is normal. Palatal movements are normal. Face symmetric. Tongue midline. Normal strength, tone, reflexes and coordination. Normal sensation. Gait deferred. ASSESSMENT/PLAN Mr. Rabon Hagee is a 45 y.o. male with ni significant medical history presenting with transient speech difficulty and right face and upper extremity numbness. He did not receive IV t-PA due to resolved symptoms.    Left hemisphericTIA:   Etiology to be dtermined  Resultant  No deficits  MRI  No acute stroke. Chiari 1 malormation with cerebellar tonsils 8mm below foramen. Acute R maxillary sinusitis  CTA head and neck  negative  2D Echo  Pending   LDL 120  HgbA1c pending   Lovenox 40 mg sq daily for VTE prophylaxis  Diet Heart Room service appropriate?: Yes; Fluid consistency:: Thin  No antithrombotic prior to admission, now on aspirin 81 mg daily   Patient counseled to be compliant with his antithrombotic medications  Ongoing aggressive stroke risk factor management  Therapy recommendations:  none Disposition:  home Hyperlipidemia  Home meds:  none  LDL 120, goal < 70 if stroke is determined dx  Consider statin    Other Active Problems  Chronic neck pain with intermittent HA  Mild ongoing  cough  Hospital day #   I have personally examined this patient, reviewed notes, independently viewed imaging studies, participated in medical decision making and plan of care. I have made any additions or clarifications directly to the above note. He presented with 30-40 minute episode of expressive aphasia with word finding difficulties and some confusion likely due to left hemispheric TIA. Lack of prior history suggestive of migraines and significant headache following this episode makes completed migraine less likely. He remains at risk for recurrent stroke, TIA, neurological worsening and needs ongoing stroke evaluation and aggressive risk factor modification. I had a long discussion with the patient, his family at the bedside and answered questions  Antony Contras, MD Medical Director [facility] Pager: [phone] 01/10/2016 5:55 PM     To contact Stroke Continuity provider, please refer to [email or web address] After hours, contact General Neurology

## 2016-01-10 NOTE — Evaluation (Signed)
Physical Therapy Evaluation and Discharge Patient Details Name: Thomas Jacobs MRN: [id number] DOB: August 07, 1971 Today's Date: 01/10/2016   History of Present Illness  Pt is a 45 y/o male who presents s/p episode of numbness in R face and R hand, as well as confusion and difficulty finding words at work. Pt reports symptoms have resolved and imaging was negative for acute abnormality.   Clinical Impression  Patient evaluated by Physical Therapy with no further acute PT needs identified. All education has been completed and the patient has no further questions. At the time of PT eval pt was able to perform all functional mobility at an independent level. Therapist and pt went for a prolonged walk and activity did not bring on or exacerbate any symptoms. See below for any follow-up Physical Therapy or equipment needs. PT is signing off. Thank you for this referral.     Follow Up Recommendations No PT follow up    Equipment Recommendations  None recommended by PT    Recommendations for Other Services       Precautions / Restrictions Precautions Precautions: None Restrictions Weight Bearing Restrictions: No      Mobility  Bed Mobility Overal bed mobility: Independent                Transfers Overall transfer level: Independent Equipment used: None                Ambulation/Gait Ambulation/Gait assistance: Independent Ambulation Distance (Feet): 1000 Feet Assistive device: None Gait Pattern/deviations: WFL(Within Functional Limits)   Gait velocity interpretation: >2.62 ft/sec, indicative of independent community ambulator    Stairs Stairs: Yes Stairs assistance: Independent Stair Management: One rail Right;Alternating pattern Number of Stairs: 10    Wheelchair Mobility    Modified Rankin (Stroke Patients Only)       Balance Overall balance assessment: Independent                                           Pertinent Vitals/Pain  Pain Assessment: No/denies pain    Home Living Family/patient expects to be discharged to:: Private residence Living Arrangements: Spouse/significant other;Children Available Help at Discharge: Family Type of Home: House Home Access: Stairs to enter   Technical brewer of Steps: 3 Home Layout: One level Home Equipment: None Additional Comments: Flight of stairs to get to work office    Prior Function Level of Independence: Independent               Hand Dominance   Dominant Hand: Right    Extremity/Trunk Assessment   Upper Extremity Assessment: Overall WFL for tasks assessed           Lower Extremity Assessment: Overall WFL for tasks assessed      Cervical / Trunk Assessment: Normal  Communication   Communication: No difficulties  Cognition Arousal/Alertness: Awake/alert Behavior During Therapy: WFL for tasks assessed/performed Overall Cognitive Status: Within Functional Limits for tasks assessed                      General Comments      Exercises        Assessment/Plan    PT Assessment Patent does not need any further PT services  PT Diagnosis Difficulty walking   PT Problem List    PT Treatment Interventions     PT Goals (Current goals can be found in the Care  Plan section) Acute Rehab PT Goals PT Goal Formulation: All assessment and education complete, DC therapy    Frequency     Barriers to discharge        Co-evaluation               End of Session   Activity Tolerance: Patient tolerated treatment well Patient left: in chair;with call bell/phone within reach Nurse Communication: Mobility status    Functional Assessment Tool Used: Clinical judgement Functional Limitation: Mobility: Walking and moving around Mobility: Walking and Moving Around Current Status [id number]): 0 percent impaired, limited or restricted Mobility: Walking and Moving Around Goal Status [id number]): 0 percent impaired, limited or  restricted Mobility: Walking and Moving Around Discharge Status [phone]): 0 percent impaired, limited or restricted    Time: [id number] PT Time Calculation (min) (ACUTE ONLY): 17 min   Charges:   PT Evaluation $PT Eval Moderate Complexity: 1 Procedure     PT G Codes:   PT G-Codes **NOT FOR INPATIENT CLASS** Functional Assessment Tool Used: Clinical judgement Functional Limitation: Mobility: Walking and moving around Mobility: Walking and Moving Around Current Status [id number]): 0 percent impaired, limited or restricted Mobility: Walking and Moving Around Goal Status [id number]): 0 percent impaired, limited or restricted Mobility: Walking and Moving Around Discharge Status [id number]): 0 percent impaired, limited or restricted    Rolinda Roan 01/10/2016, 10:30 AM   Rolinda Roan, PT, DPT Acute Rehabilitation Services Pager: [phone]

## 2016-01-10 NOTE — Care Management Note (Signed)
Case Management Note  Patient Details  Name: Daryen Teaney MRN: [id number] Date of Birth: 03-30-1971  Subjective/Objective:                    Action/Plan: Patient presented with expressive aphasia, numbness, tingling. Will follow for discharge needs pending PT/OT evals and physician orders.  Expected Discharge Date:                  Expected Discharge Plan:     In-House Referral:     Discharge planning Services     Post Acute Care Choice:    Choice offered to:     DME Arranged:    DME Agency:     HH Arranged:    HH Agency:     Status of Service:  In process, will continue to follow  Medicare Important Message Given:    Date Medicare IM Given:    Medicare IM give by:    Date Additional Medicare IM Given:    Additional Medicare Important Message give by:     If discussed at [place] of Stay Meetings, dates discussed:    Additional Comments:  Rolm Baptise, RN 01/10/2016, 10:41 AM [phone]

## 2016-01-10 NOTE — Discharge Summary (Signed)
Physician Discharge Summary  Thomas Jacobs [id number] DOB: Sep 19, 1971 DOA: 01/09/2016  PCP: Pcp Not In System  Admit date: 01/09/2016 Discharge date: 01/11/16 Recommendations for Outpatient Follow-up:  1. Pt will need to follow up with PCP in 2 weeks post discharge 2. Please obtain BMP and CBC in one week 3. 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 The patient also complained of numbness and tingling to the right side of his face and right hand. He stated that his symptoms lasted approximately 30-45 minutes. There is no focal weakness of the upper extremities. His deficits had resolved by the time he arrived to the emergency department. CT of the brain was negative for any acute abnormalities. The patient was admitted for a full stroke workup.  Consultants: Neurology  Discharge Exam: Filed Vitals:   01/10/16 1020 01/10/16 1426  BP: 112/62 97/59  Pulse: 68 76  Temp: 98.3 F (36.8 C) 98 F (36.7 C)  Resp: 18 18   Filed Vitals:   01/10/16 0400 01/10/16 0600 01/10/16 1020 01/10/16 1426  BP: 110/68 100/59 112/62 97/59  Pulse: 64 63 68 76  Temp: 97.8 F (36.6 C) 97.8 F (36.6 C) 98.3 F (36.8 C) 98 F (36.7 C)  TempSrc: Oral  Oral Oral Oral  Resp: 16 18 18 18   Height:      Weight:      SpO2: 96% 98% 98% 98%   General: A&O x 3, NAD, pleasant, cooperative Cardiovascular: RRR, no rub, no gallop, no S3 Respiratory: CTAB, no wheeze, no rhonchi Abdomen:soft, nontender, nondistended, positive bowel sounds Extremities: No edema, No lymphangitis, no petechiae  Discharge Instructions     Medication List    TAKE these medications        aspirin 81 MG chewable tablet  Chew 1 tablet (81 mg total) by mouth daily.     atorvastatin 20 MG tablet  Commonly known as:  LIPITOR  Take 1 tablet (20 mg total) by mouth daily at 6 PM.         The results of significant diagnostics from this hospitalization (including imaging, microbiology, ancillary and laboratory) are listed below for reference.    Significant Diagnostic Studies: Ct Angio Head W/cm &/or Wo Cm  01/09/2016  CLINICAL DATA:  45 year old male with 30 minutes of difficulty speaking and right face and upper extremity numbness, since resolved. Subsequent headache. TIA. Initial encounter. EXAM: CT ANGIOGRAPHY HEAD AND NECK TECHNIQUE: Multidetector CT imaging of the head and neck was performed using the standard protocol during bolus administration of intravenous contrast. Multiplanar CT image reconstructions and MIPs were obtained to evaluate the vascular anatomy. Carotid stenosis measurements (when applicable) are obtained utilizing NASCET criteria, using the distal internal carotid diameter as the denominator. CONTRAST:  26mL OMNIPAQUE IOHEXOL 350 MG/ML SOLN COMPARISON:  Head CT without contrast 1230  hours today. FINDINGS: CTA NECK Skeleton: No acute osseous abnormality identified. Right greater than left maxillary sinus mucosal thickening and/or fluid. Other neck: Negative lung apices. No superior mediastinal lymphadenopathy. Negative thyroid, larynx, pharynx (lingual tonsil hypertrophy), parapharyngeal spaces, retropharyngeal space, sublingual space, submandibular  glands, and parotid glands. No cervical lymphadenopathy. Visualized orbits and scalp soft tissues are within normal limits. Aortic arch: 3 vessel arch configuration with no arch atherosclerosis and normal great vessel origins. Right carotid system: Negative aside from tortuosity of the right ICA just below the skullbase. No cervical right carotid atherosclerosis. Left carotid system: Negative aside from left ICA tortuosity just below the skullbase. No cervical left carotid atherosclerosis. Vertebral arteries:No proximal subclavian artery stenosis. Both vertebral artery origins are normal. Both vertebral arteries are negative to the skullbase. CTA HEAD Posterior circulation: Mildly dominant distal right vertebral artery. Both PICA origins occur somewhat early (as the vessels are crossing the dura) but otherwise appear normal. Patent vertebrobasilar junction. No basilar artery stenosis. Normal SCA and PCA origins. Left posterior communicating artery is present, the right is diminutive or absent. Bilateral PCA branches are within normal limits. Anterior circulation: Both ICA siphons are patent and within normal limits. No siphon atherosclerosis. Ophthalmic and left posterior communicating artery origins appear normal. Patent carotid termini. Normal MCA and ACA origins. There is mild venous contamination about the carotid termini. Anterior communicating artery and bilateral ACA branches are within normal limits. Left MCA M1 segment, bifurcation, and left MCA branches are within normal limits. Right MCA M1 segment, bifurcation, and right MCA branches are within normal limits. Venous sinuses: Patent. Anatomic variants: None. Delayed phase: No abnormal enhancement identified. Stable and negative CT appearance of the brain. IMPRESSION: 1. Negative CTA head and neck other than bilateral ICA tortuosity just below the skullbase. 2. Stable and negative CT appearance of the brain. 3. No acute findings in the neck. Electronically  Signed   By: Genevie Ann M.D.   On: 01/09/2016 19:19   Dg Chest 2 View  01/09/2016  CLINICAL DATA:  45 year old with a possible TIA earlier today as evidenced by an episode of dysarthria while at work associated with right arm numbness. EXAM: CHEST  2 VIEW COMPARISON:  None. FINDINGS: Cardiomediastinal silhouette unremarkable. Lungs clear. Bronchovascular markings normal. Pulmonary vascularity normal. No pneumothorax. No pleural effusions. Visualized bony thorax intact. IMPRESSION: Normal examination. Electronically Signed   By: Evangeline Dakin M.D.   On: 01/09/2016 18:52   Ct Head Wo Contrast  01/09/2016  CLINICAL DATA:  Acute onset slurred speech. Right arm facial numbness today. Headache. EXAM: CT HEAD WITHOUT CONTRAST TECHNIQUE: Contiguous axial images were obtained from the base of the skull through the vertex without intravenous contrast. COMPARISON:  None. FINDINGS: No evidence of intracranial hemorrhage, brain edema, or other signs of acute infarction. No evidence of intracranial mass lesion or mass effect. No abnormal extraaxial fluid collections identified. Ventricles are normal in size. No skull abnormality identified. Mild mucosal thickening seen involving the right maxillary sinus. IMPRESSION: No acute intracranial abnormality. Mild chronic right maxillary sinusitis. Electronically Signed   By: Earle Gell M.D.   On: 01/09/2016 13:00   Ct Angio Neck W/cm &/or Wo/cm  01/09/2016  CLINICAL DATA:  45 year old male with 30 minutes of difficulty speaking and right face and upper extremity numbness, since resolved. Subsequent headache. TIA. Initial encounter. EXAM: CT ANGIOGRAPHY HEAD AND NECK TECHNIQUE: Multidetector CT imaging of the head and neck was performed using the standard protocol during bolus administration of intravenous contrast. Multiplanar  CT image reconstructions and MIPs were obtained to evaluate the vascular anatomy. Carotid stenosis measurements (when applicable) are obtained utilizing  NASCET criteria, using the distal internal carotid diameter as the denominator. CONTRAST:  27mL OMNIPAQUE IOHEXOL 350 MG/ML SOLN COMPARISON:  Head CT without contrast 1230 hours today. FINDINGS: CTA NECK Skeleton: No acute osseous abnormality identified. Right greater than left maxillary sinus mucosal thickening and/or fluid. Other neck: Negative lung apices. No superior mediastinal lymphadenopathy. Negative thyroid, larynx, pharynx (lingual tonsil hypertrophy), parapharyngeal spaces, retropharyngeal space, sublingual space, submandibular glands, and parotid glands. No cervical lymphadenopathy. Visualized orbits and scalp soft tissues are within normal limits. Aortic arch: 3 vessel arch configuration with no arch atherosclerosis and normal great vessel origins. Right carotid system: Negative aside from tortuosity of the right ICA just below the skullbase. No cervical right carotid atherosclerosis. Left carotid system: Negative aside from left ICA tortuosity just below the skullbase. No cervical left carotid atherosclerosis. Vertebral arteries:No proximal subclavian artery stenosis. Both vertebral artery origins are normal. Both vertebral arteries are negative to the skullbase. CTA HEAD Posterior circulation: Mildly dominant distal right vertebral artery. Both PICA origins occur somewhat early (as the vessels are crossing the dura) but otherwise appear normal. Patent vertebrobasilar junction. No basilar artery stenosis. Normal SCA and PCA origins. Left posterior communicating artery is present, the right is diminutive or absent. Bilateral PCA branches are within normal limits. Anterior circulation: Both ICA siphons are patent and within normal limits. No siphon atherosclerosis. Ophthalmic and left posterior communicating artery origins appear normal. Patent carotid termini. Normal MCA and ACA origins. There is mild venous contamination about the carotid termini. Anterior communicating artery and bilateral ACA  branches are within normal limits. Left MCA M1 segment, bifurcation, and left MCA branches are within normal limits. Right MCA M1 segment, bifurcation, and right MCA branches are within normal limits. Venous sinuses: Patent. Anatomic variants: None. Delayed phase: No abnormal enhancement identified. Stable and negative CT appearance of the brain. IMPRESSION: 1. Negative CTA head and neck other than bilateral ICA tortuosity just below the skullbase. 2. Stable and negative CT appearance of the brain. 3. No acute findings in the neck. Electronically Signed   By: Genevie Ann M.D.   On: 01/09/2016 19:19   Mr Brain Wo Contrast  01/09/2016  CLINICAL DATA:  Initial evaluation for acute fell of expressive aphasia, numbness and tingling on right side of face and right hand. Now resolved. EXAM: MRI HEAD WITHOUT CONTRAST TECHNIQUE: Multiplanar, multiecho pulse sequences of the brain and surrounding structures were obtained without intravenous contrast. COMPARISON:  Prior CT and CTA from earlier the same day. FINDINGS: Cerebral volume within normal limits for patient age. No focal parenchymal signal abnormality identified. No significant white matter disease. No abnormal foci of restricted diffusion to suggest acute intracranial infarct. Gray-white matter differentiation well maintained. Major intracranial vascular flow voids are preserved. No acute or chronic intracranial hemorrhage. No areas of chronic infarction. No mass lesion, midline shift, or mass effect. No hydrocephalus. No extra-axial fluid collection. Major dural sinuses are patent. Cerebellar tonsils are low lying positioned approximately 9 mm below the foramen magnum, consistent with Chiari 1 malformation. No associated syrinx or other abnormality. Pituitary gland normal.  No acute abnormality about the orbits. Mild mucosal thickening within the bilateral maxillary sinuses. Air-fluid level present within the right maxillary sinus. Minimal mucosal thickening within  the ethmoidal air cells as well. Small right mastoid effusion noted, likely benign. Trace opacity within the left mastoid air cells. Inner ear structures  grossly normal. Bone marrow signal intensity within normal limits. No scalp soft tissue abnormality. IMPRESSION: 1. No acute intracranial infarct or other process identified. 2. 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 3. Otherwise normal brain MRI. 4. Acute right maxillary sinusitis. Electronically Signed   By: Jeannine Boga M.D.   On: 01/09/2016 20:23     Microbiology: No results found for this or any previous visit (from the past 240 hour(s)).   Labs: Basic Metabolic Panel:  Recent Labs Lab 01/09/16 1215 01/09/16 1234  NA 142 143  K 4.4 4.3  CL 107 103  CO2 24  --   GLUCOSE 106* 106*  BUN 17 20  CREATININE 0.99 1.00  CALCIUM 9.7  --    Liver Function Tests:  Recent Labs Lab 01/09/16 1215  AST 26  ALT 29  ALKPHOS 60  BILITOT 0.7  PROT 7.3  ALBUMIN 4.5   No results for input(s): LIPASE, AMYLASE in the last 168 hours. No results for input(s): AMMONIA in the last 168 hours. CBC:  Recent Labs Lab 01/09/16 1215 01/09/16 1234  WBC 5.4  --   NEUTROABS 3.4  --   HGB 14.3 16.0  HCT 43.4 47.0  MCV 87.3  --   PLT 210  --    Cardiac Enzymes: No results for input(s): CKTOTAL, CKMB, CKMBINDEX, TROPONINI in the last 168 hours. BNP: Invalid input(s): POCBNP CBG: No results for input(s): GLUCAP in the last 168 hours.  Time coordinating discharge:  Greater than 30 minutes  Signed:  Cherri Yera, DO [facility] Pager: [phone] 01/10/2016, 7:17 PM

## 2016-01-10 NOTE — Progress Notes (Signed)
  Echocardiogram 2D Echocardiogram has been performed.  Diamond Nickel 01/10/2016, 3:40 PM

## 2016-01-10 NOTE — Progress Notes (Signed)
Pt arrived to 5M13 from ED.  Pt is alert and oriented.  Family at bedside.  Safety measures in place.  Will continue to monitor.   Fredrich Romans, RN

## 2016-01-11 ENCOUNTER — Other Ambulatory Visit: Payer: Self-pay | Admitting: Physician Assistant

## 2016-01-11 DIAGNOSIS — I428 Other cardiomyopathies: Secondary | ICD-10-CM | POA: Diagnosis present

## 2016-01-11 DIAGNOSIS — R258 Other abnormal involuntary movements: Secondary | ICD-10-CM | POA: Diagnosis not present

## 2016-01-11 DIAGNOSIS — I429 Cardiomyopathy, unspecified: Secondary | ICD-10-CM

## 2016-01-11 DIAGNOSIS — G458 Other transient cerebral ischemic attacks and related syndromes: Secondary | ICD-10-CM

## 2016-01-11 DIAGNOSIS — E785 Hyperlipidemia, unspecified: Secondary | ICD-10-CM | POA: Diagnosis not present

## 2016-01-11 DIAGNOSIS — G459 Transient cerebral ischemic attack, unspecified: Secondary | ICD-10-CM | POA: Diagnosis not present

## 2016-01-11 LAB — HEMOGLOBIN A1C
HEMOGLOBIN A1C: 5.8 % — AB (ref 4.8–5.6)
MEAN PLASMA GLUCOSE: 120 mg/dL

## 2016-01-11 MED ORDER — ASPIRIN EC 81 MG PO TBEC: 81.0000 mg | DELAYED_RELEASE_TABLET | Freq: Every day | ORAL | Status: DC

## 2016-01-11 MED ORDER — CARVEDILOL 3.125 MG PO TABS: 3.1250 mg | ORAL_TABLET | Freq: Two times a day (BID) | ORAL | Status: DC

## 2016-01-11 NOTE — Evaluation (Signed)
SLP Cancellation Note  Patient Details Name: Thomas Jacobs MRN: [id number] DOB: 12/12/1970   Cancelled treatment:       Reason Eval/Treat Not Completed: SLP screened, no needs identified, will sign off   Luanna Salk, [place] [facility] SLP [phone]

## 2016-01-11 NOTE — Progress Notes (Signed)
Patient was interviewed and examined. Patient denied complaints. No further complaints or recurrence of strokelike symptoms. No chest pain, dyspnea or palpitations. Vital signs and physical exam stable. Telemetry: Sinus rhythm. Abnormal echo results appreciated. Cardiology was consulted and have cleared him for discharge home and planned to evaluate him as outpatient with event monitor and stress test. Addendum made to discharge summary. Discussed with patient's spouse at bedside. Patient will be discharged home in stable condition.  Vernell Leep, MD, FACP, FHM. [facility] Pager [phone]  If 7PM-7AM, please contact night-coverage [email or web address] Password [facility] 01/11/2016, 2:05 PM

## 2016-01-11 NOTE — Progress Notes (Signed)
OT Cancellation Note  Patient Details Name: Nathanel Mubarak MRN: [id number] DOB: 06-26-1971   Cancelled Treatment:    Reason Eval/Treat Not Completed: OT screened, no needs identified, will sign off. Per PT, no acute OT needs identified and is appropriate for OT screen; signing off at this time. Please re-consult if change in medical status occurs. Thank you for this referral.  Binnie Kand M.S., OTR/L Pager: [phone]  01/11/2016, 7:24 AM

## 2016-01-11 NOTE — Care Management Note (Signed)
Case Management Note  Patient Details  Name: Thomas Jacobs MRN: [id number] Date of Birth: 03-16-1971  Subjective/Objective:                    Action/Plan: Patient discharging home self care. CM met with the patient and he does have a PCP: Dr Lennice Sites at [facility]. No further needs per CM.   Expected Discharge Date:                  Expected Discharge Plan:     In-House Referral:     Discharge planning Services     Post Acute Care Choice:    Choice offered to:     DME Arranged:    DME Agency:     HH Arranged:    HH Agency:     Status of Service:  In process, will continue to follow  Medicare Important Message Given:    Date Medicare IM Given:    Medicare IM give by:    Date Additional Medicare IM Given:    Additional Medicare Important Message give by:     If discussed at [place] of Stay Meetings, dates discussed:    Additional Comments:  [name], RN 01/11/2016, 2:17 PM

## 2016-01-11 NOTE — Progress Notes (Signed)
MD sent a note for work for the patient. Patient called back to pick the note.

## 2016-01-11 NOTE — Discharge Instructions (Signed)
Transient Ischemic Attack °A transient ischemic attack (TIA) is a "warning stroke" that causes stroke-like symptoms. Unlike a stroke, a TIA does not cause permanent damage to the brain. The symptoms of a TIA can happen very fast and do not last long. It is important to know the symptoms of a TIA and what to do. This can help prevent a major stroke or death. °CAUSES  °A TIA is caused by a temporary blockage in an artery in the brain or neck (carotid artery). The blockage does not allow the brain to get the blood supply it needs and can cause different symptoms. The blockage can be caused by either: °· A blood clot. °· Fatty buildup (plaque) in a neck or brain artery. °RISK FACTORS °· High blood pressure (hypertension). °· High cholesterol. °· Diabetes mellitus. °· Heart disease. °· The buildup of plaque in the blood vessels (peripheral artery disease or atherosclerosis). °· The buildup of plaque in the blood vessels that provide blood and oxygen to the brain (carotid artery stenosis). °· An abnormal heart rhythm (atrial fibrillation). °· Obesity. °· Using any tobacco products, including cigarettes, chewing tobacco, or electronic cigarettes. °· Taking oral contraceptives, especially in combination with using tobacco. °· Physical inactivity. °· A diet high in fats, salt (sodium), and calories. °· Excessive alcohol use. °· Use of illegal drugs (especially cocaine and methamphetamine). °· Being male. °· Being African American. °· Being over the age of 55 years. °· Family history of stroke. °· Previous history of blood clots, stroke, TIA, or heart attack. °· Sickle cell disease. °SIGNS AND SYMPTOMS  °TIA symptoms are the same as a stroke but are temporary. These symptoms usually develop suddenly, or may be newly present upon waking from sleep: °· Sudden weakness or numbness of the face, arm, or leg, especially on one side of the body. °· Sudden trouble walking or difficulty moving arms or legs. °· Sudden  confusion. °· Sudden personality changes. °· Trouble speaking (aphasia) or understanding. °· Difficulty swallowing. °· Sudden trouble seeing in one or both eyes. °· Double vision. °· Dizziness. °· Loss of balance or coordination. °· Sudden severe headache with no known cause. °· Trouble reading or writing. °· Loss of bowel or bladder control. °· Loss of consciousness. °DIAGNOSIS  °Your health care provider may be able to determine the presence or absence of a TIA based on your symptoms, history, and physical exam. CT scan of the brain is usually performed to help identify a TIA. Other tests may include: °· Electrocardiography (ECG). °· Continuous heart monitoring. °· Echocardiography. °· Carotid ultrasonography. °· MRI. °· A scan of the brain circulation. °· Blood tests. °TREATMENT  °Since the symptoms of TIA are the same as a stroke, it is important to seek treatment as soon as possible. You may need a medicine to dissolve a blood clot (thrombolytic) if that is the cause of the TIA. This medicine cannot be given if too much time has passed. Treatment may also include:  °· Rest, oxygen, fluids through an IV tube, and medicines to thin the blood (anticoagulants). °· Measures will be taken to prevent short-term and long-term complications, including infection from breathing foreign material into the lungs (aspiration pneumonia), blood clots in the legs, and falls. °· Procedures to either remove plaque in the carotid arteries or dilate carotid arteries that have narrowed due to plaque. Those procedures are: °¨ Carotid endarterectomy. °¨ Carotid angioplasty and stenting. °· Medicines and diet may be used to address diabetes, high blood pressure, and   other underlying risk factors. °HOME CARE INSTRUCTIONS  °· Take medicines only as directed by your health care provider. Follow the directions carefully. Medicines may be used to control risk factors for a stroke. Be sure you understand all your medicine instructions. °· You  may be told to take aspirin or the anticoagulant warfarin. Warfarin needs to be taken exactly as instructed. °¨ Taking too much or too little warfarin is dangerous. Too much warfarin increases the risk of bleeding. Too little warfarin continues to allow the risk for blood clots. While taking warfarin, you will need to have regular blood tests to measure your blood clotting time. A PT blood test measures how long it takes for blood to clot. Your PT is used to calculate another value called an INR. Your PT and INR help your health care provider to adjust your dose of warfarin. The dose can change for many reasons. It is critically important that you take warfarin exactly as prescribed. °¨ Many foods, especially foods high in vitamin K can interfere with warfarin and affect the PT and INR. Foods high in vitamin K include spinach, kale, broccoli, cabbage, collard and turnip greens, Brussels sprouts, peas, cauliflower, seaweed, and parsley, as well as beef and pork liver, green tea, and soybean oil. You should eat a consistent amount of foods high in vitamin K. Avoid major changes in your diet, or notify your health care provider before changing your diet. Arrange a visit with a dietitian to answer your questions. °¨ Many medicines can interfere with warfarin and affect the PT and INR. You must tell your health care provider about any and all medicines you take; this includes all vitamins and supplements. Be especially cautious with aspirin and anti-inflammatory medicines. Do not take or discontinue any prescribed or over-the-counter medicine except on the advice of your health care provider or pharmacist. °¨ Warfarin can have side effects, such as excessive bruising or bleeding. You will need to hold pressure over cuts for longer than usual. Your health care provider or pharmacist will discuss other potential side effects. °¨ Avoid sports or activities that may cause injury or bleeding. °¨ Be careful when shaving,  flossing your teeth, or handling sharp objects. °¨ Alcohol can change the body's ability to handle warfarin. It is best to avoid alcoholic drinks or consume only very small amounts while taking warfarin. Notify your health care provider if you change your alcohol intake. °¨ Notify your dentist or other health care providers before procedures. °· Eat a diet that includes 5 or more servings of fruits and vegetables each day. This may reduce the risk of stroke. Certain diets may be prescribed to address high blood pressure, high cholesterol, diabetes, or obesity. °¨ A diet low in sodium, saturated fat, trans fat, and cholesterol is recommended to manage high blood pressure. °¨ A diet low in saturated fat, trans fat, and cholesterol, and high in fiber may control cholesterol levels. °¨ A controlled-carbohydrate, controlled-sugar diet is recommended to manage diabetes. °¨ A reduced-calorie diet that is low in sodium, saturated fat, trans fat, and cholesterol is recommended to manage obesity. °· Maintain a healthy weight. °· Stay physically active. It is recommended that you get at least 30 minutes of activity on most or all days. °· Do not use any tobacco products, including cigarettes, chewing tobacco, or electronic cigarettes. If you need help quitting, ask your health care provider. °· Limit alcohol intake to no more than 1 drink per day for nonpregnant women and 2 drinks   per day for men. One drink equals 12 ounces of beer, 5 ounces of wine, or 1½ ounces of hard liquor. °· Do not abuse drugs. °· A safe home environment is important to reduce the risk of falls. Your health care provider may arrange for specialists to evaluate your home. Having grab bars in the bedroom and bathroom is often important. Your health care provider may arrange for equipment to be used at home, such as raised toilets and a seat for the shower. °· Follow all instructions for follow-up with your health care provider. This is very important.  This includes any referrals and lab tests. Proper follow-up can prevent a stroke or another TIA from occurring. °PREVENTION  °The risk of a TIA can be decreased by appropriately treating high blood pressure, high cholesterol, diabetes, heart disease, and obesity, and by quitting smoking, limiting alcohol, and staying physically active. °SEEK MEDICAL CARE IF: °· You have personality changes. °· You have difficulty swallowing. °· You are seeing double. °· You have dizziness. °· You have a fever. °SEEK IMMEDIATE MEDICAL CARE IF:  °Any of the following symptoms may represent a serious problem that is an emergency. Do not wait to see if the symptoms will go away. Get medical help right away. Call your local emergency services (911 in [place] Do not drive yourself to the hospital. °· You have sudden weakness or numbness of the face, arm, or leg, especially on one side of the body. °· You have sudden trouble walking or difficulty moving arms or legs. °· You have sudden confusion. °· You have trouble speaking (aphasia) or understanding. °· You have sudden trouble seeing in one or both eyes. °· You have a loss of balance or coordination. °· You have a sudden, severe headache with no known cause. °· You have new chest pain or an irregular heartbeat. °· You have a partial or total loss of consciousness. °MAKE SURE YOU:  °· Understand these instructions. °· Will watch your condition. °· Will get help right away if you are not doing well or get worse. °  °This information is not intended to replace advice given to you by your health care provider. Make sure you discuss any questions you have with your health care provider. °  °Document Released: 08/15/2005 Document Revised: 11/26/2014 Document Reviewed: 02/10/2014 °[employer] Interactive Patient Education ©2016 [employer] ° °

## 2016-01-11 NOTE — Progress Notes (Signed)
Workup complete. The stroke team will sign off at this time. Please call if we can be of further service. Neurology follow-up in the office with Dr. Jaynee Eagles in four weeks.   Mikey Bussing PA-C [facility] Pager [phone] 01/11/2016, 10:06 AM

## 2016-01-11 NOTE — Discharge Summary (Signed)
Physician Discharge Summary  Tiarnan Ostermeyer [id number] DOB: 21-Apr-1971 DOA: 01/09/2016  PCP: Pcp Not In System  Admit date: 01/09/2016 Discharge date: 01/11/16  Recommendations for Outpatient Follow-up:  1. Pt will need to follow up with PCP in 2 weeks post discharge 2. Dr. Sarina Ill, Neurology in one month. 3. Dr. Lyman Bishop, Cardiology on 02/01/16 at 9 AM for post hospital follow-up. Cardiologist's office will call patient with event monitor setup. 4. [facility] on 01/25/16 at 7:45 AM for exercise Myoview.  Discharge Diagnoses:   Left hemispheric TIA - Etiology not determined.? Small vessel disease - Neurology consulted and patient completed stroke evaluation. 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 Please see results below. - LDL 120 . Started statin this admission. -  Hemoglobin A1c--5.8. - Not on antithrombotic prior to admission, now on aspirin 81 MG 3 times a day - PT, OT and ST evaluated: No needs identified - UDS--neg - Neurology signed off and recommend outpatient follow-up with them in 4 weeks.  Hyperlipidemia - LDL 120, goal <70. - Started atorvastatin 20 MG daily.  Cardiomyopathy - 2-D echo shows LVEF 40-45 percent with global hypo-kinesis. Cardiology was consulted and plan for outpatient event monitor, stress test and follow-up. They have started him on low-dose carvedilol 3.125 MG twice a day for chronic systolic CHF. Clinically euvolemic on exam. No Lasix recommended. Continue aspirin. BP will not support ACEI/ARB at this time but may be considered as outpatient. Will likely need repeat echo in 3-6 months with bubble study to assess for PFO.  Discharge Condition: Improved and stable  Disposition:  Follow-up Information    Follow up  with Melvenia Beam, MD. Schedule an appointment as soon as possible for a visit in 1 month.   Specialty:  Neurology   Contact information:   [address] [place] [address] [phone]       Follow up with Pixie Casino, MD. Go on 02/01/2016.   Specialty:  Cardiology   Why:  @9 :00 for post hospital f/u. Office will call with event monitor setup.    Contact information:   [place] [address] [phone]       Follow up with [facility]. Go on 01/25/2016.   Specialty:  Cardiology   Why:  @7 :45 am for exercise myoview   Contact information:   [address] [place] [phone]    home  Diet: heart healthy Wt Readings from Last 3 Encounters:  01/09/16 80.876 kg (178 lb 4.8 oz)    History of present illness:  45 year old male with no chronic medical problems presented with an episode of dysphasia and dysarthria while at work on Feb 20th 2017. The patient also complained of numbness and tingling to the right side of his face and right hand. He stated that his symptoms lasted approximately 30-45 minutes. There is no focal weakness of the upper extremities. His deficits had resolved by the time he arrived to the emergency department. CT of the brain was negative for any acute abnormalities. The patient was admitted for a full stroke workup.  Consultants: Neurology Cardiology  Procedures 2-D echo 01/10/16: Study Conclusions  - Left ventricle: The cavity size was normal. Systolic function was mildly to moderately reduced. The estimated ejection  fraction was in the range of 40% to 45%. Moderate diffuse hypokinesis. - Tricuspid valve: There was mild regurgitation. - Pulmonic valve: There was trivial regurgitation.  Impressions:  - No cardiac source of emboli was indentified.  Subjective: Denies complaints. No further symptoms that brought him to the hospital. No CP or  dyspnea.  Discharge Exam:  Filed Vitals:   01/10/16 2220 01/11/16 0140 01/11/16 0536 01/11/16 0910  BP: 103/75 101/64 103/59 119/76  Pulse: 76 74 61 80  Temp: 97.8 F (36.6 C) 97.7 F (36.5 C) 97.9 F (36.6 C) 98.4 F (36.9 C)  TempSrc: Oral Oral Oral Oral  Resp: 20 18 18 18   Height:      Weight:      SpO2: 96% 97% 96% 97%   General: Pleasant young male sitting up comfortably in bed this morning. Spouse at bedside. Cardiovascular: RRR, no rub, no gallop, no S3. Telemetry: Sinus rhythm. Respiratory: CTAB, no wheeze, no rhonchi. No increased work of breathing. Abdomen:soft, nontender, nondistended, positive bowel sounds Extremities: No edema, No lymphangitis, no petechiae CNS: Alert and oriented. No focal neurological deficits.  Discharge Instructions      Discharge Instructions    Ambulatory referral to Neurology    Complete by:  As directed   Dr. Jaynee Eagles requests follow up for this patient in 4 weeks.     Call MD for:    Complete by:  As directed   Strokelike symptoms.     Diet - low sodium heart healthy    Complete by:  As directed      Increase activity slowly    Complete by:  As directed             Medication List    TAKE these medications        aspirin EC 81 MG tablet  Take 1 tablet (81 mg total) by mouth daily.     atorvastatin 20 MG tablet  Commonly known as:  LIPITOR  Take 1 tablet (20 mg total) by mouth daily at 6 PM.     carvedilol 3.125 MG tablet  Commonly known as:  COREG  Take 1 tablet (3.125 mg total) by mouth 2 (two) times daily with a meal.         The results of significant diagnostics from this hospitalization (including imaging, microbiology, ancillary and laboratory) are listed below for reference.    Significant Diagnostic Studies: Ct Angio Head W/cm &/or Wo Cm  01/09/2016  CLINICAL DATA:  45 year old male with 30 minutes of difficulty speaking and right face and upper extremity numbness, since resolved. Subsequent headache. TIA.  Initial encounter. EXAM: CT ANGIOGRAPHY HEAD AND NECK TECHNIQUE: Multidetector CT imaging of the head and neck was performed using the standard protocol during bolus administration of intravenous contrast. Multiplanar CT image reconstructions and MIPs were obtained to evaluate the vascular anatomy. Carotid stenosis measurements (when applicable) are obtained utilizing NASCET criteria, using the distal internal carotid diameter as the denominator. CONTRAST:  37mL OMNIPAQUE IOHEXOL 350 MG/ML SOLN COMPARISON:  Head CT without contrast 1230 hours today. FINDINGS: CTA NECK Skeleton: No acute osseous abnormality identified. Right greater than left maxillary sinus mucosal thickening and/or fluid. Other neck: Negative lung apices. No superior mediastinal lymphadenopathy. Negative thyroid, larynx, pharynx (lingual tonsil hypertrophy), parapharyngeal spaces, retropharyngeal space, sublingual space, submandibular glands, and parotid glands. No cervical lymphadenopathy. Visualized orbits and scalp soft tissues are within normal limits. Aortic arch: 3 vessel arch configuration with no arch atherosclerosis and normal great vessel origins. Right  carotid system: Negative aside from tortuosity of the right ICA just below the skullbase. No cervical right carotid atherosclerosis. Left carotid system: Negative aside from left ICA tortuosity just below the skullbase. No cervical left carotid atherosclerosis. Vertebral arteries:No proximal subclavian artery stenosis. Both vertebral artery origins are normal. Both vertebral arteries are negative to the skullbase. CTA HEAD Posterior circulation: Mildly dominant distal right vertebral artery. Both PICA origins occur somewhat early (as the vessels are crossing the dura) but otherwise appear normal. Patent vertebrobasilar junction. No basilar artery stenosis. Normal SCA and PCA origins. Left posterior communicating artery is present, the right is diminutive or absent. Bilateral PCA branches  are within normal limits. Anterior circulation: Both ICA siphons are patent and within normal limits. No siphon atherosclerosis. Ophthalmic and left posterior communicating artery origins appear normal. Patent carotid termini. Normal MCA and ACA origins. There is mild venous contamination about the carotid termini. Anterior communicating artery and bilateral ACA branches are within normal limits. Left MCA M1 segment, bifurcation, and left MCA branches are within normal limits. Right MCA M1 segment, bifurcation, and right MCA branches are within normal limits. Venous sinuses: Patent. Anatomic variants: None. Delayed phase: No abnormal enhancement identified. Stable and negative CT appearance of the brain. IMPRESSION: 1. Negative CTA head and neck other than bilateral ICA tortuosity just below the skullbase. 2. Stable and negative CT appearance of the brain. 3. No acute findings in the neck. Electronically Signed   By: Genevie Ann M.D.   On: 01/09/2016 19:19   Dg Chest 2 View  01/09/2016  CLINICAL DATA:  45 year old with a possible TIA earlier today as evidenced by an episode of dysarthria while at work associated with right arm numbness. EXAM: CHEST  2 VIEW COMPARISON:  None. FINDINGS: Cardiomediastinal silhouette unremarkable. Lungs clear. Bronchovascular markings normal. Pulmonary vascularity normal. No pneumothorax. No pleural effusions. Visualized bony thorax intact. IMPRESSION: Normal examination. Electronically Signed   By: Evangeline Dakin M.D.   On: 01/09/2016 18:52   Ct Head Wo Contrast  01/09/2016  CLINICAL DATA:  Acute onset slurred speech. Right arm facial numbness today. Headache. EXAM: CT HEAD WITHOUT CONTRAST TECHNIQUE: Contiguous axial images were obtained from the base of the skull through the vertex without intravenous contrast. COMPARISON:  None. FINDINGS: No evidence of intracranial hemorrhage, brain edema, or other signs of acute infarction. No evidence of intracranial mass lesion or mass  effect. No abnormal extraaxial fluid collections identified. Ventricles are normal in size. No skull abnormality identified. Mild mucosal thickening seen involving the right maxillary sinus. IMPRESSION: No acute intracranial abnormality. Mild chronic right maxillary sinusitis. Electronically Signed   By: Earle Gell M.D.   On: 01/09/2016 13:00   Ct Angio Neck W/cm &/or Wo/cm  01/09/2016  CLINICAL DATA:  45 year old male with 30 minutes of difficulty speaking and right face and upper extremity numbness, since resolved. Subsequent headache. TIA. Initial encounter. EXAM: CT ANGIOGRAPHY HEAD AND NECK TECHNIQUE: Multidetector CT imaging of the head and neck was performed using the standard protocol during bolus administration of intravenous contrast. Multiplanar CT image reconstructions and MIPs were obtained to evaluate the vascular anatomy. Carotid stenosis measurements (when applicable) are obtained utilizing NASCET criteria, using the distal internal carotid diameter as the denominator. CONTRAST:  37mL OMNIPAQUE IOHEXOL 350 MG/ML SOLN COMPARISON:  Head CT without contrast 1230 hours today. FINDINGS: CTA NECK Skeleton: No acute osseous abnormality identified. Right greater than left maxillary sinus mucosal thickening and/or fluid. Other neck: Negative lung apices. No superior mediastinal lymphadenopathy. Negative  thyroid, larynx, pharynx (lingual tonsil hypertrophy), parapharyngeal spaces, retropharyngeal space, sublingual space, submandibular glands, and parotid glands. No cervical lymphadenopathy. Visualized orbits and scalp soft tissues are within normal limits. Aortic arch: 3 vessel arch configuration with no arch atherosclerosis and normal great vessel origins. Right carotid system: Negative aside from tortuosity of the right ICA just below the skullbase. No cervical right carotid atherosclerosis. Left carotid system: Negative aside from left ICA tortuosity just below the skullbase. No cervical left carotid  atherosclerosis. Vertebral arteries:No proximal subclavian artery stenosis. Both vertebral artery origins are normal. Both vertebral arteries are negative to the skullbase. CTA HEAD Posterior circulation: Mildly dominant distal right vertebral artery. Both PICA origins occur somewhat early (as the vessels are crossing the dura) but otherwise appear normal. Patent vertebrobasilar junction. No basilar artery stenosis. Normal SCA and PCA origins. Left posterior communicating artery is present, the right is diminutive or absent. Bilateral PCA branches are within normal limits. Anterior circulation: Both ICA siphons are patent and within normal limits. No siphon atherosclerosis. Ophthalmic and left posterior communicating artery origins appear normal. Patent carotid termini. Normal MCA and ACA origins. There is mild venous contamination about the carotid termini. Anterior communicating artery and bilateral ACA branches are within normal limits. Left MCA M1 segment, bifurcation, and left MCA branches are within normal limits. Right MCA M1 segment, bifurcation, and right MCA branches are within normal limits. Venous sinuses: Patent. Anatomic variants: None. Delayed phase: No abnormal enhancement identified. Stable and negative CT appearance of the brain. IMPRESSION: 1. Negative CTA head and neck other than bilateral ICA tortuosity just below the skullbase. 2. Stable and negative CT appearance of the brain. 3. No acute findings in the neck. Electronically Signed   By: Genevie Ann M.D.   On: 01/09/2016 19:19   Mr Brain Wo Contrast  01/09/2016  CLINICAL DATA:  Initial evaluation for acute fell of expressive aphasia, numbness and tingling on right side of face and right hand. Now resolved. EXAM: MRI HEAD WITHOUT CONTRAST TECHNIQUE: Multiplanar, multiecho pulse sequences of the brain and surrounding structures were obtained without intravenous contrast. COMPARISON:  Prior CT and CTA from earlier the same day. FINDINGS: Cerebral  volume within normal limits for patient age. No focal parenchymal signal abnormality identified. No significant white matter disease. No abnormal foci of restricted diffusion to suggest acute intracranial infarct. Gray-white matter differentiation well maintained. Major intracranial vascular flow voids are preserved. No acute or chronic intracranial hemorrhage. No areas of chronic infarction. No mass lesion, midline shift, or mass effect. No hydrocephalus. No extra-axial fluid collection. Major dural sinuses are patent. Cerebellar tonsils are low lying positioned approximately 9 mm below the foramen magnum, consistent with Chiari 1 malformation. No associated syrinx or other abnormality. Pituitary gland normal.  No acute abnormality about the orbits. Mild mucosal thickening within the bilateral maxillary sinuses. Air-fluid level present within the right maxillary sinus. Minimal mucosal thickening within the ethmoidal air cells as well. Small right mastoid effusion noted, likely benign. Trace opacity within the left mastoid air cells. Inner ear structures grossly normal. Bone marrow signal intensity within normal limits. No scalp soft tissue abnormality. IMPRESSION: 1. No acute intracranial infarct or other process identified. 2. 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 3. Otherwise normal brain MRI. 4. Acute right maxillary sinusitis. Electronically Signed   By: Jeannine Boga M.D.   On: 01/09/2016 20:23     Microbiology: No results found for this or any previous visit (from the  past 240 hour(s)).   Labs: Basic Metabolic Panel:  Recent Labs Lab 01/09/16 1215 01/09/16 1234  NA 142 143  K 4.4 4.3  CL 107 103  CO2 24  --   GLUCOSE 106* 106*  BUN 17 20  CREATININE 0.99 1.00  CALCIUM 9.7  --    Liver Function Tests:  Recent Labs Lab 01/09/16 1215  AST 26  ALT 29  ALKPHOS 60  BILITOT 0.7  PROT 7.3  ALBUMIN 4.5   No results for  input(s): LIPASE, AMYLASE in the last 168 hours. No results for input(s): AMMONIA in the last 168 hours. CBC:  Recent Labs Lab 01/09/16 1215 01/09/16 1234  WBC 5.4  --   NEUTROABS 3.4  --   HGB 14.3 16.0  HCT 43.4 47.0  MCV 87.3  --   PLT 210  --    Cardiac Enzymes: No results for input(s): CKTOTAL, CKMB, CKMBINDEX, TROPONINI in the last 168 hours. BNP: Invalid input(s): POCBNP CBG: No results for input(s): GLUCAP in the last 168 hours.  Time coordinating discharge:  Greater than 30 minutes  Signed:  Vernell Leep, MD, FACP, FHM. [facility] Pager [phone]  If 7PM-7AM, please contact night-coverage [email or web address] Password [email or web address] 01/11/2016, 2:03 PM

## 2016-01-11 NOTE — Consult Note (Signed)
CARDIOLOGY CONSULT NOTE   Patient ID: Thomas Jacobs MRN: [id number] DOB/AGE: [birth year] 45 y.o.  Admit date: 01/09/2016  Primary Physician   Pcp Not In System Primary Cardiologist   Dr. Debara Pickett (NEW) Reason for Consultation   Low EF Requesting Physician  Dr. Algis Liming  HPI: Thomas Jacobs is a 45 y.o. male with a no significant past medical history who came to [facility] ED by EMS for evaluation of an acute episode of speaking difficulty and numbness on right side. Workup revealed left hemispheric TIA. 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 CT angiogram head and neck negative for hemodynamically significant lesions. Echocardiogram showed left ventricular function of 40-45%, moderate diffuse hypokinesis, mild TR, no cardiac source of emboli.  The patient is a football and Herbalist. He is very active. Denies use of tobacco abuse or illicit drug use. Never smoker. No family history of cardiac disease. The patient has a history of syncope (2 episodes) when he was 45 years old. It was felt that it was due to low blood pressure. No episode in past 30 years. Approximately 6-7 years ago patient had a "knot" at left collarbone. He was felt that intermittently he has a reduced blood flow to his left arm. Echocardiogram at that time was normal. It was intermittent and last episode occurred approximately 3-4 years ago.  EKG on admission showed normal sinus rhythm. The patient denies nausea, vomiting, fever, chest pain, palpitations, shortness of breath, orthopnea, PND, dizziness, syncope, cough, congestion, abdominal pain, hematochezia, melena, lower extremity edema.   Past Medical History  Diagnosis Date  . Occasional tremors      History reviewed. No pertinent past surgical history.  Syncope as a child Knot at Left Coller bone  No Known Allergies  I have reviewed the patient's current  medications .  stroke: mapping our early stages of recovery book   Does not apply Once  . aspirin  81 mg Oral Daily  . atorvastatin  20 mg Oral q1800  . enoxaparin (LOVENOX) injection  40 mg Subcutaneous Q24H  . gi cocktail  30 mL Oral TID     albuterol, ondansetron (ZOFRAN) IV  Prior to Admission medications   Medication Sig Start Date End Date Taking? Authorizing Provider  aspirin 81 MG chewable tablet Chew 1 tablet (81 mg total) by mouth daily. 01/10/16   Orson Eva, MD  atorvastatin (LIPITOR) 20 MG tablet Take 1 tablet (20 mg total) by mouth daily at 6 PM. 01/10/16   Orson Eva, MD     Social History   Social History  . Marital Status: Married    Spouse Name: N/A  . Number of Children: N/A  . Years of Education: N/A   Occupational History  . Not on file.   Social History Main Topics  . Smoking status: Never Smoker   . Smokeless tobacco: Not on file  . Alcohol Use: Not on file  . Drug Use: Not on file  . Sexual Activity: Not on file   Other Topics Concern  . Not on file   Social History Narrative  . No narrative on file    No family status information on file.   No family history of cardiac disease or congential heart disease.    ROS:  Full 14 point review of systems complete and found to be negative unless listed above.  Physical Exam: Blood pressure 119/76, pulse 80, temperature 98.4 F (36.9  C), temperature source Oral, resp. rate 18, height 5\' 11"  (1.803 m), weight 178 lb 4.8 oz (80.876 kg), SpO2 97 %.  General: Well developed, well nourished, male in no acute distress Head: Eyes PERRLA, No xanthomas. Normocephalic and atraumatic, oropharynx without edema or exudate.  Lungs: Resp regular and unlabored, CTA. Heart: RRR no s3, s4, or murmurs..   Neck: No carotid bruits. No lymphadenopathy. No  JVD. Abdomen: Bowel sounds present, abdomen soft and non-tender without masses or hernias noted. Msk:  No spine or cva tenderness. No weakness, no joint deformities or  effusions. Extremities: No clubbing, cyanosis or edema. DP/PT/Radials 2+ and equal bilaterally. Neuro: Alert and oriented X 3. No focal deficits noted. Psych:  Good affect, responds appropriately Skin: No rashes or lesions noted.  Labs:   Lab Results  Component Value Date   WBC 5.4 01/09/2016   HGB 16.0 01/09/2016   HCT 47.0 01/09/2016   MCV 87.3 01/09/2016   PLT 210 01/09/2016    Recent Labs  01/09/16 1215  INR 1.08    Recent Labs Lab 01/09/16 1215 01/09/16 1234  NA 142 143  K 4.4 4.3  CL 107 103  CO2 24  --   BUN 17 20  CREATININE 0.99 1.00  CALCIUM 9.7  --   PROT 7.3  --   BILITOT 0.7  --   ALKPHOS 60  --   ALT 29  --   AST 26  --   GLUCOSE 106* 106*  ALBUMIN 4.5  --    No results found for: MG No results for input(s): CKTOTAL, CKMB, TROPONINI in the last 72 hours.  Recent Labs  01/09/16 1233  TROPIPOC 0.00   No results found for: PROBNP Lab Results  Component Value Date   CHOL 177 01/10/2016   HDL 27* 01/10/2016   LDLCALC 120* 01/10/2016   TRIG 149 01/10/2016    Echo: 01/10/16 LV EF: 40% -  45%  ------------------------------------------------------------------- Indications:   TIA 435.9.  ------------------------------------------------------------------- History:  PMH: Occasional tremors.  ------------------------------------------------------------------- Study Conclusions  - Left ventricle: The cavity size was normal. Systolic function was mildly to moderately reduced. The estimated ejection fraction was in the range of 40% to 45%. Moderate diffuse hypokinesis. - Tricuspid valve: There was mild regurgitation. - Pulmonic valve: There was trivial regurgitation.  Impressions:  - No cardiac source of emboli was indentified.  ECG:   Vent. rate 78 BPM PR interval 158 ms QRS duration 100 ms QT/QTc 372/424 ms P-R-T axes 68 45 64  Radiology:  Ct Angio Head W/cm &/or Wo Cm  01/09/2016  CLINICAL DATA:  45 year old male with  30 minutes of difficulty speaking and right face and upper extremity numbness, since resolved. Subsequent headache. TIA. Initial encounter. EXAM: CT ANGIOGRAPHY HEAD AND NECK TECHNIQUE: Multidetector CT imaging of the head and neck was performed using the standard protocol during bolus administration of intravenous contrast. Multiplanar CT image reconstructions and MIPs were obtained to evaluate the vascular anatomy. Carotid stenosis measurements (when applicable) are obtained utilizing NASCET criteria, using the distal internal carotid diameter as the denominator. CONTRAST:  2mL OMNIPAQUE IOHEXOL 350 MG/ML SOLN COMPARISON:  Head CT without contrast 1230 hours today. FINDINGS: CTA NECK Skeleton: No acute osseous abnormality identified. Right greater than left maxillary sinus mucosal thickening and/or fluid. Other neck: Negative lung apices. No superior mediastinal lymphadenopathy. Negative thyroid, larynx, pharynx (lingual tonsil hypertrophy), parapharyngeal spaces, retropharyngeal space, sublingual space, submandibular glands, and parotid glands. No cervical lymphadenopathy. Visualized orbits and scalp soft tissues  are within normal limits. Aortic arch: 3 vessel arch configuration with no arch atherosclerosis and normal great vessel origins. Right carotid system: Negative aside from tortuosity of the right ICA just below the skullbase. No cervical right carotid atherosclerosis. Left carotid system: Negative aside from left ICA tortuosity just below the skullbase. No cervical left carotid atherosclerosis. Vertebral arteries:No proximal subclavian artery stenosis. Both vertebral artery origins are normal. Both vertebral arteries are negative to the skullbase. CTA HEAD Posterior circulation: Mildly dominant distal right vertebral artery. Both PICA origins occur somewhat early (as the vessels are crossing the dura) but otherwise appear normal. Patent vertebrobasilar junction. No basilar artery stenosis. Normal SCA and  PCA origins. Left posterior communicating artery is present, the right is diminutive or absent. Bilateral PCA branches are within normal limits. Anterior circulation: Both ICA siphons are patent and within normal limits. No siphon atherosclerosis. Ophthalmic and left posterior communicating artery origins appear normal. Patent carotid termini. Normal MCA and ACA origins. There is mild venous contamination about the carotid termini. Anterior communicating artery and bilateral ACA branches are within normal limits. Left MCA M1 segment, bifurcation, and left MCA branches are within normal limits. Right MCA M1 segment, bifurcation, and right MCA branches are within normal limits. Venous sinuses: Patent. Anatomic variants: None. Delayed phase: No abnormal enhancement identified. Stable and negative CT appearance of the brain. IMPRESSION: 1. Negative CTA head and neck other than bilateral ICA tortuosity just below the skullbase. 2. Stable and negative CT appearance of the brain. 3. No acute findings in the neck. Electronically Signed   By: Genevie Ann M.D.   On: 01/09/2016 19:19   Dg Chest 2 View  01/09/2016  CLINICAL DATA:  45 year old with a possible TIA earlier today as evidenced by an episode of dysarthria while at work associated with right arm numbness. EXAM: CHEST  2 VIEW COMPARISON:  None. FINDINGS: Cardiomediastinal silhouette unremarkable. Lungs clear. Bronchovascular markings normal. Pulmonary vascularity normal. No pneumothorax. No pleural effusions. Visualized bony thorax intact. IMPRESSION: Normal examination. Electronically Signed   By: Evangeline Dakin M.D.   On: 01/09/2016 18:52   Ct Head Wo Contrast  01/09/2016  CLINICAL DATA:  Acute onset slurred speech. Right arm facial numbness today. Headache. EXAM: CT HEAD WITHOUT CONTRAST TECHNIQUE: Contiguous axial images were obtained from the base of the skull through the vertex without intravenous contrast. COMPARISON:  None. FINDINGS: No evidence of  intracranial hemorrhage, brain edema, or other signs of acute infarction. No evidence of intracranial mass lesion or mass effect. No abnormal extraaxial fluid collections identified. Ventricles are normal in size. No skull abnormality identified. Mild mucosal thickening seen involving the right maxillary sinus. IMPRESSION: No acute intracranial abnormality. Mild chronic right maxillary sinusitis. Electronically Signed   By: Earle Gell M.D.   On: 01/09/2016 13:00   Ct Angio Neck W/cm &/or Wo/cm  01/09/2016  CLINICAL DATA:  45 year old male with 30 minutes of difficulty speaking and right face and upper extremity numbness, since resolved. Subsequent headache. TIA. Initial encounter. EXAM: CT ANGIOGRAPHY HEAD AND NECK TECHNIQUE: Multidetector CT imaging of the head and neck was performed using the standard protocol during bolus administration of intravenous contrast. Multiplanar CT image reconstructions and MIPs were obtained to evaluate the vascular anatomy. Carotid stenosis measurements (when applicable) are obtained utilizing NASCET criteria, using the distal internal carotid diameter as the denominator. CONTRAST:  34mL OMNIPAQUE IOHEXOL 350 MG/ML SOLN COMPARISON:  Head CT without contrast 1230 hours today. FINDINGS: CTA NECK Skeleton: No acute osseous abnormality identified.  Right greater than left maxillary sinus mucosal thickening and/or fluid. Other neck: Negative lung apices. No superior mediastinal lymphadenopathy. Negative thyroid, larynx, pharynx (lingual tonsil hypertrophy), parapharyngeal spaces, retropharyngeal space, sublingual space, submandibular glands, and parotid glands. No cervical lymphadenopathy. Visualized orbits and scalp soft tissues are within normal limits. Aortic arch: 3 vessel arch configuration with no arch atherosclerosis and normal great vessel origins. Right carotid system: Negative aside from tortuosity of the right ICA just below the skullbase. No cervical right carotid  atherosclerosis. Left carotid system: Negative aside from left ICA tortuosity just below the skullbase. No cervical left carotid atherosclerosis. Vertebral arteries:No proximal subclavian artery stenosis. Both vertebral artery origins are normal. Both vertebral arteries are negative to the skullbase. CTA HEAD Posterior circulation: Mildly dominant distal right vertebral artery. Both PICA origins occur somewhat early (as the vessels are crossing the dura) but otherwise appear normal. Patent vertebrobasilar junction. No basilar artery stenosis. Normal SCA and PCA origins. Left posterior communicating artery is present, the right is diminutive or absent. Bilateral PCA branches are within normal limits. Anterior circulation: Both ICA siphons are patent and within normal limits. No siphon atherosclerosis. Ophthalmic and left posterior communicating artery origins appear normal. Patent carotid termini. Normal MCA and ACA origins. There is mild venous contamination about the carotid termini. Anterior communicating artery and bilateral ACA branches are within normal limits. Left MCA M1 segment, bifurcation, and left MCA branches are within normal limits. Right MCA M1 segment, bifurcation, and right MCA branches are within normal limits. Venous sinuses: Patent. Anatomic variants: None. Delayed phase: No abnormal enhancement identified. Stable and negative CT appearance of the brain. IMPRESSION: 1. Negative CTA head and neck other than bilateral ICA tortuosity just below the skullbase. 2. Stable and negative CT appearance of the brain. 3. No acute findings in the neck. Electronically Signed   By: Genevie Ann M.D.   On: 01/09/2016 19:19   Mr Brain Wo Contrast  01/09/2016  CLINICAL DATA:  Initial evaluation for acute fell of expressive aphasia, numbness and tingling on right side of face and right hand. Now resolved. EXAM: MRI HEAD WITHOUT CONTRAST TECHNIQUE: Multiplanar, multiecho pulse sequences of the brain and surrounding  structures were obtained without intravenous contrast. COMPARISON:  Prior CT and CTA from earlier the same day. FINDINGS: Cerebral volume within normal limits for patient age. No focal parenchymal signal abnormality identified. No significant white matter disease. No abnormal foci of restricted diffusion to suggest acute intracranial infarct. Gray-white matter differentiation well maintained. Major intracranial vascular flow voids are preserved. No acute or chronic intracranial hemorrhage. No areas of chronic infarction. No mass lesion, midline shift, or mass effect. No hydrocephalus. No extra-axial fluid collection. Major dural sinuses are patent. Cerebellar tonsils are low lying positioned approximately 9 mm below the foramen magnum, consistent with Chiari 1 malformation. No associated syrinx or other abnormality. Pituitary gland normal.  No acute abnormality about the orbits. Mild mucosal thickening within the bilateral maxillary sinuses. Air-fluid level present within the right maxillary sinus. Minimal mucosal thickening within the ethmoidal air cells as well. Small right mastoid effusion noted, likely benign. Trace opacity within the left mastoid air cells. Inner ear structures grossly normal. Bone marrow signal intensity within normal limits. No scalp soft tissue abnormality. IMPRESSION: 1. No acute intracranial infarct or other process identified. 2. 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 3. Otherwise normal brain MRI. 4. Acute right maxillary sinusitis. Electronically Signed   By: Pincus Badder.D.  On: 01/09/2016 20:23    ASSESSMENT AND PLAN:      1. Left ventricular dysfunction - Echocardiogram showed left ventricular function of 40-45%, moderate diffuse hypokinesis, mild TR, no cardiac source of emboli. His EF was normal approximately 7 years ago when he was evaluated for Knot @ left collar bone.  - Denies any exertional chest pain or  shortness of breath. No signs of volume overload on exam. - Seems patient will need ischemic evaluation. Likely cardiac catheterization for definite evaluation of coronary arteries.  2. History of Knot at left collar bone - He had a reduced blood flow to his left arm during that episode. Last episode occurred approximately 3-4 years ago. Differential include subclavian artery stenosis however CTA of neck does not showed any stenosis.   3. History of syncope as a child - 2 episodes. Last episode approximately 30 years ago. No family hx of congential heart disease. R/o PFO?  4. TIA (transient ischemic attack) - Tot administered TPA secondary to Deficits rapidly resolved  5. Hyperlipidemia - On statin   Signed: Brysin Towery, PA 01/11/2016, 11:48 AM Pager [phone]  Co-Sign MD

## 2016-01-13 ENCOUNTER — Telehealth: Payer: Self-pay | Admitting: Internal Medicine

## 2016-01-13 NOTE — Telephone Encounter (Signed)
Pt wants to know if he can take Fish Oil and Garlic [id number] mg along with his other medicine?

## 2016-01-13 NOTE — Telephone Encounter (Signed)
No DPR on file. Call to patient went unanswered. Routed to [place] for any recommendations on meds.

## 2016-01-14 NOTE — Telephone Encounter (Signed)
Both are fine with his current medications 

## 2016-01-16 NOTE — Telephone Encounter (Signed)
Called pt. Goes straight t VM, full VM box.

## 2016-01-17 ENCOUNTER — Telehealth: Payer: Self-pay | Admitting: Internal Medicine

## 2016-01-17 NOTE — Telephone Encounter (Signed)
Received Attending Physician Statement from [place] for Dr Debara Pickett to review, complete and sign.  Received via Fax.  Sent to [place] @ [facility] for letter/packet to be sent to patient.

## 2016-01-20 ENCOUNTER — Telehealth (HOSPITAL_COMMUNITY): Payer: Self-pay

## 2016-01-20 NOTE — Telephone Encounter (Signed)
Encounter complete. 

## 2016-01-25 ENCOUNTER — Ambulatory Visit (INDEPENDENT_AMBULATORY_CARE_PROVIDER_SITE_OTHER): Payer: Managed Care, Other (non HMO)

## 2016-01-25 ENCOUNTER — Other Ambulatory Visit: Payer: Self-pay | Admitting: Physician Assistant

## 2016-01-25 ENCOUNTER — Ambulatory Visit (HOSPITAL_COMMUNITY)
Admission: RE | Admit: 2016-01-25 | Discharge: 2016-01-25 | Disposition: A | Discharge status: Home / Self Care | Payer: Managed Care, Other (non HMO) | Source: Ambulatory Visit | Attending: Cardiovascular Disease | Admitting: Cardiovascular Disease

## 2016-01-25 DIAGNOSIS — I4891 Unspecified atrial fibrillation: Secondary | ICD-10-CM

## 2016-01-25 DIAGNOSIS — G459 Transient cerebral ischemic attack, unspecified: Secondary | ICD-10-CM

## 2016-01-25 DIAGNOSIS — I429 Cardiomyopathy, unspecified: Secondary | ICD-10-CM | POA: Diagnosis not present

## 2016-01-25 DIAGNOSIS — R5383 Other fatigue: Secondary | ICD-10-CM | POA: Insufficient documentation

## 2016-01-25 LAB — MYOCARDIAL PERFUSION IMAGING
CHL CUP RESTING HR STRESS: 74 {beats}/min
CHL RATE OF PERCEIVED EXERTION: 16
CSEPED: 12 min
Estimated workload: 13.4 METS
LVDIAVOL: 116 mL (ref 62–150)
LVSYSVOL: 62 mL
MPHR: 175 {beats}/min
NUC STRESS TID: 0.9
Peak HR: 157 {beats}/min
Percent HR: 89 %
SDS: 0
SRS: 3
SSS: 3

## 2016-01-25 MED ORDER — TECHNETIUM TC 99M SESTAMIBI GENERIC - CARDIOLITE
30.5000 | Freq: Once | INTRAVENOUS | Status: AC | PRN
  Administered 2016-01-25: 31 via INTRAVENOUS

## 2016-01-25 MED ORDER — TECHNETIUM TC 99M SESTAMIBI GENERIC - CARDIOLITE
9.8000 | Freq: Once | INTRAVENOUS | Status: AC | PRN
  Administered 2016-01-25: 10 via INTRAVENOUS

## 2016-02-01 ENCOUNTER — Ambulatory Visit (INDEPENDENT_AMBULATORY_CARE_PROVIDER_SITE_OTHER): Payer: Managed Care, Other (non HMO) | Admitting: Internal Medicine

## 2016-02-01 ENCOUNTER — Encounter: Payer: Self-pay | Admitting: Internal Medicine

## 2016-02-01 VITALS — BP 98/70 | HR 88 | Ht 71.0 in | Wt 179.0 lb

## 2016-02-01 DIAGNOSIS — E785 Hyperlipidemia, unspecified: Secondary | ICD-10-CM | POA: Diagnosis not present

## 2016-02-01 DIAGNOSIS — I429 Cardiomyopathy, unspecified: Secondary | ICD-10-CM | POA: Diagnosis not present

## 2016-02-01 DIAGNOSIS — I428 Other cardiomyopathies: Secondary | ICD-10-CM

## 2016-02-01 DIAGNOSIS — G459 Transient cerebral ischemic attack, unspecified: Secondary | ICD-10-CM | POA: Diagnosis not present

## 2016-02-01 NOTE — Patient Instructions (Signed)
Your physician wants you to follow-up in: 6 months with Dr. Debara Pickett. You will receive a reminder letter in the mail two months in advance. If you don't receive a letter, please call our office to schedule the follow-up appointment.  Your physician has requested that you have an echocardiogram with bubble study in 6 months @ [address]. Echocardiography is a painless test that uses sound waves to create images of your heart. It provides your doctor with information about the size and shape of your heart and how well your heart's chambers and valves are working. This procedure takes approximately one hour. There are no restrictions for this procedure.  Your physician recommends that you return for lab work FASTING in 6 months prior to your office visit.

## 2016-02-01 NOTE — Progress Notes (Signed)
OFFICE NOTE  Chief Complaint:  Follow-up  Primary Care Physician: Isaias Cowman, PA-C  HPI:  Thomas Jacobs is a 45 y.o. male with little past medical history, who presents with speech difficulty, right facial numbness and thought to have TIA. He had CT angiogram and head MRI which demonstrated Chiari Malformation, but this does not explain acute TIA symptoms. Cardiac echo was obtained which shows a global hypokinesis and EF of 40-45% - this is a new finding. He has few, if any risk factors for CAD. He has reported some fatigue recently, which was passed off as stress. He previously had an echo at [facility] center for left arm pain which was positional and he was told it was claudication. However, his CTA does not show and left subclavian stenosis or abnormal angulation which would preclude that. Other possible causes of TIA could be a-fib (increased risk due to cardiomyopathy - atria are normal size) or possible PFO? Need to r/o CAD as a cause of his cardiomyopathy. I recommended an outpatient exercise myoview. He was fitted with an outpatient monitor and started on low dose coreg [id number] BID for systolic CHF.   Thomas Jacobs returns today for follow-up. He denies any chest pain or worsening shortness of breath. He's had no further TIA events. He is compliant with his medications. He underwent an exercise Myoview which showed no ischemia and EF of 47%. He seems to be tolerating Coreg although blood pressure is low normal at 98/70. He denies any dizziness or presyncopal symptoms. He is a little bit more tired which may be related to the medicine. He has not started any exercise program. He reports no side effects from Lipitor.  PMHx:  Past Medical History  Diagnosis Date  . Occasional tremors     History reviewed. No pertinent past surgical history.  FAMHx:  History reviewed. No pertinent family history.  SOCHx:   reports that he has never smoked. He does not have any smokeless  tobacco history on file. His alcohol and drug histories are not on file.  ALLERGIES:  No Known Allergies  ROS: Pertinent items noted in HPI and remainder of comprehensive ROS otherwise negative.  HOME MEDS: Current Outpatient Prescriptions  Medication Sig Dispense Refill  . aspirin EC 81 MG tablet Take 1 tablet (81 mg total) by mouth Thomas. 30 tablet 0  . atorvastatin (LIPITOR) 20 MG tablet Take 1 tablet (20 mg total) by mouth Thomas at 6 PM. 30 tablet 0  . carvedilol (COREG) 3.125 MG tablet Take 1 tablet (3.125 mg total) by mouth 2 (two) times Thomas with a meal. 60 tablet 0   No current facility-administered medications for this visit.    LABS/IMAGING: No results found for this or any previous visit (from the past 48 hour(s)). No results found.  WEIGHTS: Wt Readings from Last 3 Encounters:  02/01/16 179 lb (81.194 kg)  01/25/16 178 lb (80.74 kg)  01/09/16 178 lb 4.8 oz (80.876 kg)    VITALS: BP 98/70 mmHg  Pulse 88  Ht 5\' 11"  (1.803 m)  Wt 179 lb (81.194 kg)  BMI 24.98 kg/m2  EXAM: General appearance: alert and no distress Lungs: clear to auscultation bilaterally Heart: regular rate and rhythm, S1, S2 normal, no murmur, click, rub or gallop Extremities: extremities normal, atraumatic, no cyanosis or edema Neurologic: Grossly normal  EKG: Deferred   ASSESSMENT: 1. TIA 2. Nonischemic cardiopathy EF 47% by recent nuclear stress testing (40-45% by echo)  3. Dyslipidemia  PLAN: 1.  Thomas Jacobs has a nonischemic cardiomyopathy most likely with negative nuclear stress testing and may have had a small improvement in his EF on low-dose carvedilol. Blood pressure will not allow the addition of ACE-I, ARB or Entresto.  he seems to be tolerating Lipitor without side effects. I like to repeat an echocardiogram in 6 months with a bubble study to evaluate for possible PFO. In addition will repeat a lipid profile at that time. He's continuing to wear monitor to look for occult  atrial fibrillation. We will contact him with those results after 30 days. Otherwise continue his current medications.   Pixie Casino, MD, [facility] Attending Cardiologist [place] C Talib Headley 02/01/2016, 9:20 AM

## 2016-02-03 ENCOUNTER — Telehealth: Payer: Self-pay | Admitting: Internal Medicine

## 2016-02-03 ENCOUNTER — Encounter: Payer: Self-pay | Admitting: *Deleted

## 2016-02-03 NOTE — Telephone Encounter (Signed)
Pt provided fax number to send to. Letter sent.

## 2016-02-03 NOTE — Telephone Encounter (Signed)
Pt needs a note clearing him to return to work w/ no restrictions. States this was discussed at Wednesday appt but he did not get note that day.  Routed to Dr. Debara Pickett to verify [place]. Pt can pick up note today or have it faxed to his office on Monday.

## 2016-02-03 NOTE — Telephone Encounter (Signed)
Yes, ok to return to work from a cardiac standpoint without restrictions. He needs to complete wearing the 30 day monitor.  Dr. Lemmie Evens

## 2016-02-03 NOTE — Telephone Encounter (Signed)
Pt saw Dr Debara Pickett on Wednesday,he forgot to get a note to return to work on Monday(01-2016). He needs this note today please.

## 2016-02-09 ENCOUNTER — Encounter: Payer: Self-pay | Admitting: Neurology

## 2016-02-09 ENCOUNTER — Ambulatory Visit (INDEPENDENT_AMBULATORY_CARE_PROVIDER_SITE_OTHER): Payer: Managed Care, Other (non HMO) | Admitting: Neurology

## 2016-02-09 VITALS — BP 105/70 | HR 73 | Ht 71.0 in | Wt 180.6 lb

## 2016-02-09 DIAGNOSIS — G458 Other transient cerebral ischemic attacks and related syndromes: Secondary | ICD-10-CM | POA: Diagnosis not present

## 2016-02-09 DIAGNOSIS — IMO0002 Reserved for concepts with insufficient information to code with codable children: Secondary | ICD-10-CM

## 2016-02-09 DIAGNOSIS — Q0700 Arnold-Chiari syndrome without spina bifida or hydrocephalus: Secondary | ICD-10-CM | POA: Diagnosis not present

## 2016-02-09 MED ORDER — ASPIRIN EC 81 MG PO TBEC: 81.0000 mg | DELAYED_RELEASE_TABLET | Freq: Every day | ORAL | Status: DC

## 2016-02-09 MED ORDER — CARVEDILOL 3.125 MG PO TABS: 3.1250 mg | ORAL_TABLET | Freq: Two times a day (BID) | ORAL | Status: DC

## 2016-02-09 MED ORDER — ATORVASTATIN CALCIUM 20 MG PO TABS: 20.0000 mg | ORAL_TABLET | Freq: Every day | ORAL | Status: DC

## 2016-02-09 NOTE — Progress Notes (Signed)
[facility]    Provider:  Dr Jaynee Eagles Referring Provider:  [facility] Primary Care Physician:  Isaias Cowman, PA-C  CC:  TIA  HPI:  Thomas Jacobs is a 45 y.o. male with PMHx HLD. Patient was admitted to [facility] in February 20 after acute onset of right face and arm sensory changes in speech abnormality. No associated symptoms. This never happened to him before. No history of diabetes, hypertension or smoking or alcohol. Symptoms lasted for 15-20 minutes. The deficits resolved completely. He denied any focal weakness or other associated symptoms. CT of his head was negative as was MRI. Patient was not given TPA secondary to rapidly improving symptoms. Patient was admitted to [facility] for stroke evaluation. 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 No history of stroke or family history of stroke. Reviewed images below with patient including Chiari malformation. Patient denies any regular headaches however he does feel paresthesias in the back of his head with Valsalva or sneezing. Otherwise no symptoms associated with his Chiari malformation. Cardiac echo was obtained which shows a global hypokinesis and EF of 40-45%. Patient is following with cardiology for this.He underwent an exercise Myoview which showed no ischemia and EF of 47%. He seems to be tolerating Coreg although blood pressure is low normal at 98/70. He denies any dizziness or presyncopal symptoms  Reviewed notes, labs and imaging from outside physicians, which showed:  MRI of the brain 01/09/2016: 1. No acute intracranial infarct or other process identified. 2. 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 3. Otherwise normal brain MRI. 4. Acute right maxillary sinusitis.  CTA of the head and neck February 20th 2017: 1. Negative CTA head and neck other than bilateral ICA tortuosity just below the skullbase. 2. Stable and  negative CT appearance of the brain. 3. No acute findings in the neck.  Study Conclusions  - Left ventricle: The cavity size was normal. Systolic function was  mildly to moderately reduced. The estimated ejection fraction was  in the range of 40% to 45%. Moderate diffuse hypokinesis. - Tricuspid valve: There was mild regurgitation. - Pulmonic valve: There was trivial regurgitation.  Impressions:  - No cardiac source of emboli was indentified.  Review of Systems: Patient complains of symptoms per HPI as well as the following symptoms: no CP, no SOB. Pertinent negatives per HPI. All others negative.   Social History   Social History  . Marital Status: Married    Spouse Name: Melissa  . Number of Children: 2  . Years of Education: 12   Occupational History  . [employer]    Social History Main Topics  . Smoking status: Never Smoker   . Smokeless tobacco: Not on file  . Alcohol Use: No  . Drug Use: No  . Sexual Activity: Not on file   Other Topics Concern  . Not on file   Social History Narrative   Lives and wife and kids (2 boys, 72 and 65)   Caffeine use: coffee- 1 cup per day (decaf)    Family History  Problem Relation Age of Onset  . Stroke Neg Hx     Past Medical History  Diagnosis Date  . Occasional tremors   . High cholesterol     Past Surgical History  Procedure Laterality Date  . Hernia repair Bilateral 1994    Current Outpatient Prescriptions  Medication Sig Dispense Refill  . aspirin EC 81 MG tablet Take 1 tablet (  81 mg total) by mouth daily. 30 tablet 12  . atorvastatin (LIPITOR) 20 MG tablet Take 1 tablet (20 mg total) by mouth daily at 6 PM. 30 tablet 12  . carvedilol (COREG) 3.125 MG tablet Take 1 tablet (3.125 mg total) by mouth 2 (two) times daily with a meal. 60 tablet 11   No current facility-administered medications for this visit.    Allergies as of 02/09/2016  . (No Known Allergies)    Vitals: BP 105/70 mmHg  Pulse 73  Ht  5\' 11"  (1.803 m)  Wt 180 lb 9.6 oz (81.92 kg)  BMI 25.20 kg/m2  SpO2 98% Last Weight:  Wt Readings from Last 1 Encounters:  02/09/16 180 lb 9.6 oz (81.92 kg)   Last Height:   Ht Readings from Last 1 Encounters:  02/09/16 5\' 11"  (1.803 m)   Physical exam: Exam: Gen: NAD, conversant                     CV: RRR, no MRG. No Carotid Bruits. No peripheral edema, warm, nontender Eyes: Conjunctivae clear without exudates or hemorrhage  Neuro: Detailed Neurologic Exam  Speech:    Speech is normal; fluent and spontaneous with normal comprehension.  Cognition:    The patient is oriented to person, place, and time;     recent and remote memory intact;     language fluent;     normal attention, concentration,     fund of knowledge Cranial Nerves:    The pupils are equal, round, and reactive to light. The fundi are normal and spontaneous venous pulsations are present. Visual fields are full to finger confrontation. Extraocular movements are intact. Trigeminal sensation is intact and the muscles of mastication are normal. The face is symmetric. The palate elevates in the midline. Hearing intact. Voice is normal. Shoulder shrug is normal. The tongue has normal motion without fasciculations.   Coordination:    Normal finger to nose and heel to shin. Normal rapid alternating movements.   Gait:    Heel-toe and tandem gait are normal.   Motor Observation:    No asymmetry, no atrophy, and no involuntary movements noted. Tone:    Normal muscle tone.    Posture:    Posture is normal. normal erect    Strength:    Strength is V/V in the upper and lower limbs.      Sensation: intact to LT     Reflex Exam:  DTR's:    Deep tendon reflexes in the upper and lower extremities are symmetrical bilaterally.   Toes:    The toes are downgoing bilaterally.   Clonus:    Clonus is absent.   ASSESSMENT/PLAN [name] is a 45 y.o. male with no significant medical history presenting  with transient speech difficulty and right face and upper extremity numbness. He did not receive IV t-PA due to resolved symptoms.   1. Left hemisphericTIA: Etiology to be determined 2. Resultant No deficits 3. MRI No acute stroke. Chiari 1 malormation with cerebellar tonsils 15mm below foramen. Acute R maxillary sinusitis 4. CTA head and neck negative 5. 2D Echo Nonischemic cardiomypathy EF 47% by recent nuclear stress testing (40-45% by echo) . He is following with cardiology and will repeat an echocardiogram in 6 months with bubble study to evaluate for possible PFO. Patient wearing a monitor for evaluation of atrial fibrillation.Coreg was started. 6. LDL 120, statin was initiated 7. HgbA1c 5.8, recommend weight loss and diet 8. No antithrombotic prior  to admission, now on aspirin 81 mg daily 9. Patient counseled to be compliant with his antithrombotic medications 10. Ongoing aggressive stroke risk factor management with primary care 11. Discussed Chiari malformation. Patient is relatively asymptomatic. Discussed further evaluation. patient can follow in clinic with me in the future if necessary.  I had a long d/w patient about her recent stroke, risk for recurrent stroke/TIAs, personally independently reviewed imaging studies and stroke evaluation results and answered questions.Continue ASA for secondary stroke prevention and maintain strict control of hypertension with blood pressure goal below 130/90, diabetes with hemoglobin A1c goal below 6.5% and lipids with LDL cholesterol goal below 70 mg/dL. Patient is on Coreg to nonischemic cardiomyopathy and is following with cardiology with recent heart monitor.  I also advised the patient to eat a healthy diet with plenty of whole grains, cereals, fruits and vegetables, exercise regularly and maintain ideal body weight .Followup in the future with me inif necessary.    Sarina Ill, MD  [facility] [address]  [place], [place] [address]  Phone [phone] Fax [phone]

## 2016-02-09 NOTE — Patient Instructions (Signed)
Remember to drink plenty of fluid, eat healthy meals and do not skip any meals. Try to eat protein with a every meal and eat a healthy snack such as fruit or nuts in between meals. Try to keep a regular sleep-wake schedule and try to exercise daily, particularly in the form of walking, 20-30 minutes a day, if you can.   As far as your medications are concerned, I would like to suggest: continue current medications  I would like to see you back as needed, sooner if we need to. Please call us with any interim questions, concerns, problems, updates or refill requests.   Our phone number is [phone]. We also have an after hours call service for urgent matters and there is a physician on-call for urgent questions. For any emergencies you know to call 911 or go to the nearest emergency room

## 2016-02-12 ENCOUNTER — Encounter: Payer: Self-pay | Admitting: Neurology

## 2016-02-15 ENCOUNTER — Other Ambulatory Visit: Payer: Self-pay | Admitting: Neurology

## 2016-02-15 MED ORDER — ATORVASTATIN CALCIUM 20 MG PO TABS: 20.0000 mg | ORAL_TABLET | Freq: Every day | ORAL | Status: DC

## 2016-02-17 ENCOUNTER — Encounter: Payer: Self-pay | Admitting: Internal Medicine

## 2016-02-17 ENCOUNTER — Ambulatory Visit (INDEPENDENT_AMBULATORY_CARE_PROVIDER_SITE_OTHER): Payer: Managed Care, Other (non HMO) | Admitting: Internal Medicine

## 2016-02-17 VITALS — BP 112/72 | HR 84 | Ht 71.0 in | Wt 179.4 lb

## 2016-02-17 DIAGNOSIS — I429 Cardiomyopathy, unspecified: Secondary | ICD-10-CM | POA: Diagnosis not present

## 2016-02-17 DIAGNOSIS — Z79899 Other long term (current) drug therapy: Secondary | ICD-10-CM

## 2016-02-17 DIAGNOSIS — I428 Other cardiomyopathies: Secondary | ICD-10-CM

## 2016-02-17 DIAGNOSIS — K921 Melena: Secondary | ICD-10-CM | POA: Diagnosis not present

## 2016-02-17 DIAGNOSIS — R5383 Other fatigue: Secondary | ICD-10-CM | POA: Diagnosis not present

## 2016-02-17 DIAGNOSIS — E785 Hyperlipidemia, unspecified: Secondary | ICD-10-CM

## 2016-02-17 NOTE — Patient Instructions (Addendum)
Please go have lab work done today @ [address] -- CBC, CMET, stool sample  You have been referred to [facility] - for Monday per Dr. Debara Pickett  Stay off ALL medications for now  Take prilosec OTC or nexium OTC - one pill twice daily until you see the GI doctor.

## 2016-02-19 DIAGNOSIS — K922 Gastrointestinal hemorrhage, unspecified: Secondary | ICD-10-CM | POA: Insufficient documentation

## 2016-02-19 NOTE — Progress Notes (Signed)
OFFICE NOTE  Chief Complaint:  Dark stools, fatigue  Primary Care Physician: Isaias Cowman, PA-C  HPI:  Thomas Jacobs is a 45 y.o. male with little past medical history, who presents with speech difficulty, right facial numbness and thought to have TIA. He had CT angiogram and head MRI which demonstrated Chiari Malformation, but this does not explain acute TIA symptoms. Cardiac echo was obtained which shows a global hypokinesis and EF of 40-45% - this is a new finding. He has few, if any risk factors for CAD. He has reported some fatigue recently, which was passed off as stress. He previously had an echo at [facility] center for left arm pain which was positional and he was told it was claudication. However, his CTA does not show and left subclavian stenosis or abnormal angulation which would preclude that. Other possible causes of TIA could be a-fib (increased risk due to cardiomyopathy - atria are normal size) or possible PFO? Need to r/o CAD as a cause of his cardiomyopathy. I recommended an outpatient exercise myoview. He was fitted with an outpatient monitor and started on low dose coreg [id number] for systolic CHF.   [name] returns today for follow-up. He denies any chest pain or worsening shortness of breath. He's had no further TIA events. He is compliant with his medications. He underwent an exercise Myoview which showed no ischemia and EF of 47%. He seems to be tolerating Coreg although blood pressure is low normal at 98/70. He denies any dizziness or presyncopal symptoms. He is a little bit more tired which may be related to the medicine. He has not started any exercise program. He reports no side effects from Lipitor.  [name] returns today early for follow-up as he called the office noting that he had some dark stools and fatigue. For some reason he attributed this to his beta blocker and cholesterol medicine which were new. He therefore discontinued that. However,  he continued to take low-dose aspirin. He says over the past week he's had a couple days of very "foul-smelling" gas and dark, tarry looking stools. He also noted blood that was in the toilet. Although he thought this was unusual he was not overly concerned by it. He did call the office and was advised to make an appointment with me. He denies any stomach pain. He does have a history of hemorrhoids however this degree of bleeding seems to be much worse for him.  PMHx:  Past Medical History  Diagnosis Date  . Occasional tremors   . High cholesterol     Past Surgical History  Procedure Laterality Date  . Hernia repair Bilateral 1994    FAMHx:  Family History  Problem Relation Age of Onset  . Stroke Neg Hx     SOCHx:   reports that he has never smoked. He does not have any smokeless tobacco history on file. He reports that he does not drink alcohol or use illicit drugs.  ALLERGIES:  No Known Allergies  ROS: Pertinent items noted in HPI and remainder of comprehensive ROS otherwise negative.  HOME MEDS: Current Outpatient Prescriptions  Medication Sig Dispense Refill  . aspirin EC 81 MG tablet Take 1 tablet (81 mg total) by mouth daily. 30 tablet 12  . atorvastatin (LIPITOR) 20 MG tablet Take 1 tablet (20 mg total) by mouth daily at 6 PM. 30 tablet 12  . carvedilol (COREG) 3.125 MG tablet Take 1 tablet (3.125 mg total) by mouth 2 (two) times  daily with a meal. 60 tablet 11   No current facility-administered medications for this visit.    LABS/IMAGING: No results found for this or any previous visit (from the past 48 hour(s)). No results found.  WEIGHTS: Wt Readings from Last 3 Encounters:  02/17/16 179 lb 6.4 oz (81.375 kg)  02/09/16 180 lb 9.6 oz (81.92 kg)  02/01/16 179 lb (81.194 kg)    VITALS: BP 112/72 mmHg  Pulse 84  Ht 5\' 11"  (1.803 m)  Wt 179 lb 6.4 oz (81.375 kg)  BMI 25.03 kg/m2  EXAM: General appearance: alert and no distress Lungs: clear to  auscultation bilaterally Heart: regular rate and rhythm, S1, S2 normal, no murmur, click, rub or gallop Extremities: extremities normal, atraumatic, no cyanosis or edema Skin: Skin color, texture, turgor normal. No rashes or lesions Neurologic: Grossly normal  EKG: Deferred   ASSESSMENT: 1. Probable upper GI bleed  PLAN: 1.   [name] is here today for what is likely an upper GI bleed. He is on aspirin and may have a duodenal lesion as he does not describe any stomach pain. I advised to discontinue aspirin immediately. He should also stay off of his beta blocker and cholesterol medicine for now until this resolves. I recommended stat labs today to check an H&H and if it is significantly low I will direct him to the emergency department. He is not interested in going to the hospital. In fact, he is not sure if he'll make it to the lab tonight as he needs to make his son's baseball game at 6 PM. I told him that he could develop worsening or more severe bleeding and that could potentially be life-threatening. He understands that he should go to the emergency department if he becomes significantly more orthostatic or symptomatic. We will also try to arrange an appointment with GI as soon as possible for evaluation. I've advised him to get over-the-counter omeprazole or Nexium and take a 20 mg pill twice daily until he sees the GI doctors. As of yet, we have little data back from his monitor and he should continue wearing it. We'll plan to see him back in 2 weeks with his regularly scheduled appointment.  Pixie Casino, MD, [facility] Attending Cardiologist [place] C Ryosuke Ericksen 02/19/2016, 4:11 PM

## 2016-02-20 ENCOUNTER — Other Ambulatory Visit: Payer: Self-pay | Admitting: Internal Medicine

## 2016-02-20 DIAGNOSIS — K921 Melena: Secondary | ICD-10-CM

## 2016-02-20 LAB — COMPREHENSIVE METABOLIC PANEL
ALT: 22 U/L (ref 9–46)
AST: 21 U/L (ref 10–40)
Albumin: 4.2 g/dL (ref 3.6–5.1)
Alkaline Phosphatase: 56 U/L (ref 40–115)
BILIRUBIN TOTAL: 0.4 mg/dL (ref 0.2–1.2)
BUN: 18 mg/dL (ref 7–25)
CALCIUM: 9.1 mg/dL (ref 8.6–10.3)
CO2: 29 mmol/L (ref 20–31)
Chloride: 105 mmol/L (ref 98–110)
Creat: 1.03 mg/dL (ref 0.60–1.35)
GLUCOSE: 87 mg/dL (ref 65–99)
Potassium: 4.3 mmol/L (ref 3.5–5.3)
SODIUM: 139 mmol/L (ref 135–146)
Total Protein: 6.2 g/dL (ref 6.1–8.1)

## 2016-02-20 LAB — CBC
HEMATOCRIT: 31.4 % — AB (ref 38.5–50.0)
Hemoglobin: 10.6 g/dL — ABNORMAL LOW (ref 13.2–17.1)
MCH: 30 pg (ref 27.0–33.0)
MCHC: 33.8 g/dL (ref 32.0–36.0)
MCV: 89 fL (ref 80.0–100.0)
MPV: 9.6 fL (ref 7.5–12.5)
Platelets: 297 10*3/uL (ref 140–400)
RBC: 3.53 MIL/uL — ABNORMAL LOW (ref 4.20–5.80)
RDW: 14.3 % (ref 11.0–15.0)
WBC: 5.3 10*3/uL (ref 3.8–10.8)

## 2016-02-20 MED ORDER — PANTOPRAZOLE SODIUM 40 MG PO TBEC: 40.0000 mg | DELAYED_RELEASE_TABLET | Freq: Every day | ORAL | Status: DC

## 2016-02-20 NOTE — Progress Notes (Signed)
Called patient this morning to check on him with regards to GI bleeding. He notes that the blood in his stool is now stopped when he stopped aspirin. His bowel movements are now more soft and brown. I still recommend getting lab work that we ordered and taking daily Protonix for acid reduction. He has an appointment scheduled with the gastroenterologist on Wednesday.  Pixie Casino, MD, [facility] Attending Cardiologist [place]

## 2016-02-22 ENCOUNTER — Ambulatory Visit (INDEPENDENT_AMBULATORY_CARE_PROVIDER_SITE_OTHER): Payer: Managed Care, Other (non HMO) | Admitting: Gastroenterology

## 2016-02-22 ENCOUNTER — Encounter: Payer: Self-pay | Admitting: Gastroenterology

## 2016-02-22 VITALS — BP 122/80 | HR 68 | Ht 71.0 in | Wt 178.0 lb

## 2016-02-22 DIAGNOSIS — K922 Gastrointestinal hemorrhage, unspecified: Secondary | ICD-10-CM | POA: Insufficient documentation

## 2016-02-22 DIAGNOSIS — D62 Acute posthemorrhagic anemia: Secondary | ICD-10-CM | POA: Insufficient documentation

## 2016-02-22 LAB — FECAL OCCULT BLOOD, IMMUNOCHEMICAL: FECAL OCCULT BLOOD: POSITIVE — AB

## 2016-02-22 MED ORDER — NA SULFATE-K SULFATE-MG SULF 17.5-3.13-1.6 GM/177ML PO SOLN: ORAL | Status: DC

## 2016-02-22 NOTE — Progress Notes (Signed)
Reviewed and agree with management plan.  Oronde Hallenbeck T. Amalia Edgecombe, MD FACG 

## 2016-02-22 NOTE — Patient Instructions (Signed)
You have been scheduled for an endoscopy and colonoscopy. Please follow the written instructions given to you at your visit today. Please pick up your prep supplies at the pharmacy within the next 1-3 days. [place], [place]. If you use inhalers (even only as needed), please bring them with you on the day of your procedure. Your physician has requested that you go to www.startemmi.com and enter the access code given to you at your visit today. This web site gives a general overview about your procedure. However, you should still follow specific instructions given to you by our office regarding your preparation for the procedure.

## 2016-02-22 NOTE — Progress Notes (Signed)
Reviewed and agree with initial management plan.  Emylee Decelle T. Pearlie Nies, MD FACG 

## 2016-02-22 NOTE — Progress Notes (Signed)
02/22/2016 Thomas Jacobs [id number] 1970/12/02   HISTORY OF PRESENT ILLNESS:  This is a pleasant 45 year old male who is new to our office and has been referred here by Dr. Debara Pickett for evaluation of GI bleeding. Patient tolerated 6 or 7 weeks ago he had a suspected TIA and was started on Lipitor, blood pressure medicine, and aspirin. Then a couple of weeks ago he developed very black tarry stools and when the stools in the toilet bowl left a red bloody residue. This continued for a few days and when he finally saw Dr. Debara Pickett Dr. healthy told him to discontinue the aspirin and patient had discontinued his other medications as well.  Immediately after stopping the ASA the bleeding stopped.  Hgb was 16 grams just one month ago and 2 days ago it was 10.6 grams.  Was hemoccult positive even after overt bleeding stopped.  He denies any abdominal pain.  Never had any sign of GI bleeding in the past.  Denies any OTC medication use.   Past Medical History  Diagnosis Date  . Occasional tremors   . High cholesterol    Past Surgical History  Procedure Laterality Date  . Hernia repair Bilateral 1994    reports that he has never smoked. He has never used smokeless tobacco. He reports that he does not drink alcohol or use illicit drugs. family history is negative for Stroke. No Known Allergies    Outpatient Encounter Prescriptions as of 02/22/2016  Medication Sig  . [DISCONTINUED] aspirin EC 81 MG tablet Take 1 tablet (81 mg total) by mouth daily. (Patient not taking: Reported on 02/22/2016)  . [DISCONTINUED] atorvastatin (LIPITOR) 20 MG tablet Take 1 tablet (20 mg total) by mouth daily at 6 PM. (Patient not taking: Reported on 02/22/2016)  . [DISCONTINUED] carvedilol (COREG) 3.125 MG tablet Take 1 tablet (3.125 mg total) by mouth 2 (two) times daily with a meal. (Patient not taking: Reported on 02/22/2016)  . [DISCONTINUED] pantoprazole (PROTONIX) 40 MG tablet Take 1 tablet (40 mg total) by mouth daily.  (Patient not taking: Reported on 02/22/2016)   No facility-administered encounter medications on file as of 02/22/2016.     REVIEW OF SYSTEMS  : All other systems reviewed and negative except where noted in the History of Present Illness.   PHYSICAL EXAM: BP 122/80 mmHg  Pulse 68  Ht 5\' 11"  (1.803 m)  Wt 178 lb (80.74 kg)  BMI 24.84 kg/m2  SpO2 98% General: Well developed white male in no acute distress Head: Normocephalic and atraumatic Eyes:  Sclerae anicteric, conjunctiva pink. Ears: Normal auditory acuity Lungs: Clear throughout to auscultation Heart: Regular rate and rhythm Abdomen: Soft, non-distended.  Normal bowel sounds.  Non-tender. Rectal:  Will be done at the time of colonoscopy. Musculoskeletal: Symmetrical with no gross deformities  Skin: No lesions on visible extremities Extremities: No edema  Neurological: Alert oriented x 4, grossly non-focal Psychological:  Alert and cooperative. Normal mood and affect  ASSESSMENT AND PLAN: -Gastrointestinal bleeding with blood loss anemia while on ASA:  Hgb down 5.5 grams in the past month.  No longer having bleeding.  Sounds more like UGIB but will schedule for EGD and colonoscopy to rule out source of bleeding even though it is now resolved off of ASA.  Rule out ulcer disease, malignancy (less likely), etc.  The risks, benefits, and alternatives to EGD and colonoscopy were discussed with the patient and he consents to proceed.   CC:  Secundino Ginger, PA-C

## 2016-02-27 ENCOUNTER — Encounter: Payer: Self-pay | Admitting: Internal Medicine

## 2016-03-19 DIAGNOSIS — D126 Benign neoplasm of colon, unspecified: Secondary | ICD-10-CM

## 2016-03-19 HISTORY — DX: Benign neoplasm of colon, unspecified: D12.6

## 2016-03-22 ENCOUNTER — Encounter: Payer: Self-pay | Admitting: Gastroenterology

## 2016-04-03 ENCOUNTER — Encounter: Payer: Self-pay | Admitting: Gastroenterology

## 2016-04-03 ENCOUNTER — Ambulatory Visit (AMBULATORY_SURGERY_CENTER): Payer: Managed Care, Other (non HMO) | Admitting: Gastroenterology

## 2016-04-03 VITALS — BP 100/59 | HR 69 | Temp 98.7°F | Resp 12 | Ht 71.0 in | Wt 178.0 lb

## 2016-04-03 DIAGNOSIS — K921 Melena: Secondary | ICD-10-CM

## 2016-04-03 DIAGNOSIS — D125 Benign neoplasm of sigmoid colon: Secondary | ICD-10-CM | POA: Diagnosis not present

## 2016-04-03 DIAGNOSIS — D128 Benign neoplasm of rectum: Secondary | ICD-10-CM | POA: Diagnosis not present

## 2016-04-03 DIAGNOSIS — D123 Benign neoplasm of transverse colon: Secondary | ICD-10-CM | POA: Diagnosis not present

## 2016-04-03 DIAGNOSIS — K635 Polyp of colon: Secondary | ICD-10-CM

## 2016-04-03 DIAGNOSIS — K2100 Gastro-esophageal reflux disease with esophagitis, without bleeding: Secondary | ICD-10-CM

## 2016-04-03 DIAGNOSIS — D62 Acute posthemorrhagic anemia: Secondary | ICD-10-CM | POA: Diagnosis not present

## 2016-04-03 MED ORDER — SODIUM CHLORIDE 0.9 % IV SOLN: 500.0000 mL | INTRAVENOUS | Status: DC

## 2016-04-03 MED ORDER — OMEPRAZOLE 20 MG PO CPDR: 20.0000 mg | DELAYED_RELEASE_CAPSULE | Freq: Every day | ORAL | Status: DC

## 2016-04-03 NOTE — Op Note (Signed)
[facility] Patient Name: Thomas Jacobs Procedure Date: 04/03/2016 2:38 PM MRN: [id number] Endoscopist: Ladene Artist , MD Age: 45 Referring MD:  Date of Birth: 1971/09/27 Gender: Male Procedure:                Colonoscopy Indications:              Evaluation of unexplained GI bleeding, Melena,                            Acute post hemorrhagic anemia Medicines:                Monitored Anesthesia Care Procedure:                Pre-Anesthesia Assessment:                           - Prior to the procedure, a History and Physical                            was performed, and patient medications and                            allergies were reviewed. The patient's tolerance of                            previous anesthesia was also reviewed. The risks                            and benefits of the procedure and the sedation                            options and risks were discussed with the patient.                            All questions were answered, and informed consent                            was obtained. Prior Anticoagulants: The patient has                            taken no previous anticoagulant or antiplatelet                            agents. ASA Grade Assessment: II - A patient with                            mild systemic disease. After reviewing the risks                            and benefits, the patient was deemed in                            satisfactory condition to undergo the procedure.  After obtaining informed consent, the colonoscope                            was passed under direct vision. Throughout the                            procedure, the patient's blood pressure, pulse, and                            oxygen saturations were monitored continuously. The                            Model PCF-[id number]) scope was introduced                            through the anus and advanced to the the cecum,                           identified by appendiceal orifice and ileocecal                            valve. The colonoscopy was performed without                            difficulty. The patient tolerated the procedure                            well. The quality of the bowel preparation was                            excellent. The ileocecal valve, appendiceal                            orifice, and rectum were photographed. Scope In: 2:42:43 PM Scope Out: 3:00:01 PM Scope Withdrawal Time: 0 hours 14 minutes 55 seconds  Total Procedure Duration: 0 hours 17 minutes 18 seconds  Findings:                 The digital rectal exam was normal.                           Three sessile polyps were found in the rectum and                            sigmoid colon. The polyps were 6 to 8 mm in size.                            These polyps were removed with a cold snare.                            Resection and retrieval were complete.                           A 4 mm polyp was found in the transverse colon.  The                            polyp was sessile. The polyp was removed with a                            cold biopsy forceps. Resection and retrieval were                            complete.                           The retroflexed view of the distal rectum and anal                            verge was normal and showed no anal or rectal                            abnormalities.                           The exam was otherwise normal throughout the                            examined colon. Complications:            No immediate complications. Estimated Blood Loss:     Estimated blood loss was minimal. Impression:               - Three 6 to 8 mm polyps in the rectum and in the                            sigmoid colon, removed with a cold snare. Resected                            and retrieved.                           - One 4 mm polyp in the transverse colon, removed                             with a cold biopsy forceps. Resected and retrieved. Recommendation:           - Patient has a contact number available for                            emergencies. The signs and symptoms of potential                            delayed complications were discussed with the                            patient. Return to normal activities tomorrow.                            Written discharge instructions  were provided to the                            patient.                           - Resume previous diet.                           - Continue present medications.                           - Await pathology results.                           - Repeat colonoscopy in 5 years for surveillance if                            polyp(s) precancerous, otherwise 10 years for                            screening. Ladene Artist, MD 04/03/2016 3:04:36 PM This report has been signed electronically.

## 2016-04-03 NOTE — Progress Notes (Signed)
Patient awakening,vss,report to rn 

## 2016-04-03 NOTE — Op Note (Signed)
[facility] Patient Name: Thomas Jacobs Procedure Date: 04/03/2016 2:38 PM MRN: [id number] Endoscopist: Ladene Artist , MD Age: 45 Referring MD:  Date of Birth: [birth year] Gender: Male Procedure:                Upper GI endoscopy Indications:              Suspected upper gastrointestinal bleeding,                            Gastrointestinal bleeding source not found during                            previous colonoscopy, Acute post hemorrhagic                            anemia, Melena Medicines:                Monitored Anesthesia Care Procedure:                Pre-Anesthesia Assessment:                           - Prior to the procedure, a History and Physical                            was performed, and patient medications and                            allergies were reviewed. The patient's tolerance of                            previous anesthesia was also reviewed. The risks                            and benefits of the procedure and the sedation                            options and risks were discussed with the patient.                            All questions were answered, and informed consent                            was obtained. Prior Anticoagulants: The patient has                            taken no previous anticoagulant or antiplatelet                            agents. ASA Grade Assessment: II - A patient with                            mild systemic disease. After reviewing the risks  and benefits, the patient was deemed in                            satisfactory condition to undergo the procedure.                           After obtaining informed consent, the endoscope was                            passed under direct vision. Throughout the                            procedure, the patient's blood pressure, pulse, and                            oxygen saturations were monitored continuously. The         Model GIF-HQ190 [id number]) scope was introduced                            through the mouth, and advanced to the second part                            of duodenum. The upper GI endoscopy was                            accomplished without difficulty. The patient                            tolerated the procedure well. Scope In: Scope Out: Findings:                 LA Grade A (one or more mucosal breaks less than 5                            mm, not extending between tops of 2 mucosal folds)                            esophagitis with no bleeding was found in the                            distal esophagus.                           The exam of the esophagus was otherwise normal.                           A small hiatal hernia was present.                           The exam of the stomach was otherwise normal.                           The examined duodenum was normal. Complications:            No immediate complications. Estimated Blood  Loss:     Estimated blood loss: none. Impression:               - LA Grade A reflux esophagitis.                           - Small hiatal hernia. Recommendation:           - Patient has a contact number available for                            emergencies. The signs and symptoms of potential                            delayed complications were discussed with the                            patient. Return to normal activities tomorrow.                            Written discharge instructions were provided to the                            patient.                           - Resume previous diet with antireflux measures.                           - Use Prilosec (omeprazole) 20 mg PO daily long                            term, 1 year of refells                           - Return to my office in 3 months, check CBC                           - Discontinue aspirin and NSAIDs for 1 month. Ladene Artist, MD 04/03/2016 3:15:28 PM This report has  been signed electronically.

## 2016-04-03 NOTE — Patient Instructions (Signed)

## 2016-04-03 NOTE — Progress Notes (Signed)
Called to room to assist during endoscopic procedure.  Patient ID and intended procedure confirmed with present staff. Received instructions for my participation in the procedure from the performing physician.  

## 2016-04-04 ENCOUNTER — Telehealth: Payer: Self-pay | Admitting: *Deleted

## 2016-04-04 NOTE — Telephone Encounter (Signed)
  Follow up Call-  Call back number 04/03/2016  Post procedure Call Back phone  # [phone]  Permission to leave phone message Yes     No answer at the number listed. Unable to leave message due to "mail box full and can not take messages" per phone voice mail.

## 2016-04-09 ENCOUNTER — Encounter: Payer: Managed Care, Other (non HMO) | Admitting: Gastroenterology

## 2016-04-10 ENCOUNTER — Encounter: Payer: Self-pay | Admitting: Gastroenterology

## 2016-12-12 ENCOUNTER — Encounter (HOSPITAL_COMMUNITY): Payer: Self-pay | Admitting: Radiology

## 2016-12-12 ENCOUNTER — Telehealth (HOSPITAL_COMMUNITY): Payer: Self-pay | Admitting: Radiology

## 2016-12-12 NOTE — Telephone Encounter (Signed)
Machine states unable to leave message

## 2017-12-30 ENCOUNTER — Ambulatory Visit: Payer: Managed Care, Other (non HMO) | Admitting: Gastroenterology

## 2018-02-04 IMAGING — CR DG CHEST 2V
2 series · 2 of 2 positions shown · non-contrast
Comparison: None.

CLINICAL DATA: 44-year-old with a possible TIA earlier today as
evidenced by an episode of dysarthria while at work associated with
right arm numbness.

EXAM:
CHEST  2 VIEW

[chest pa]
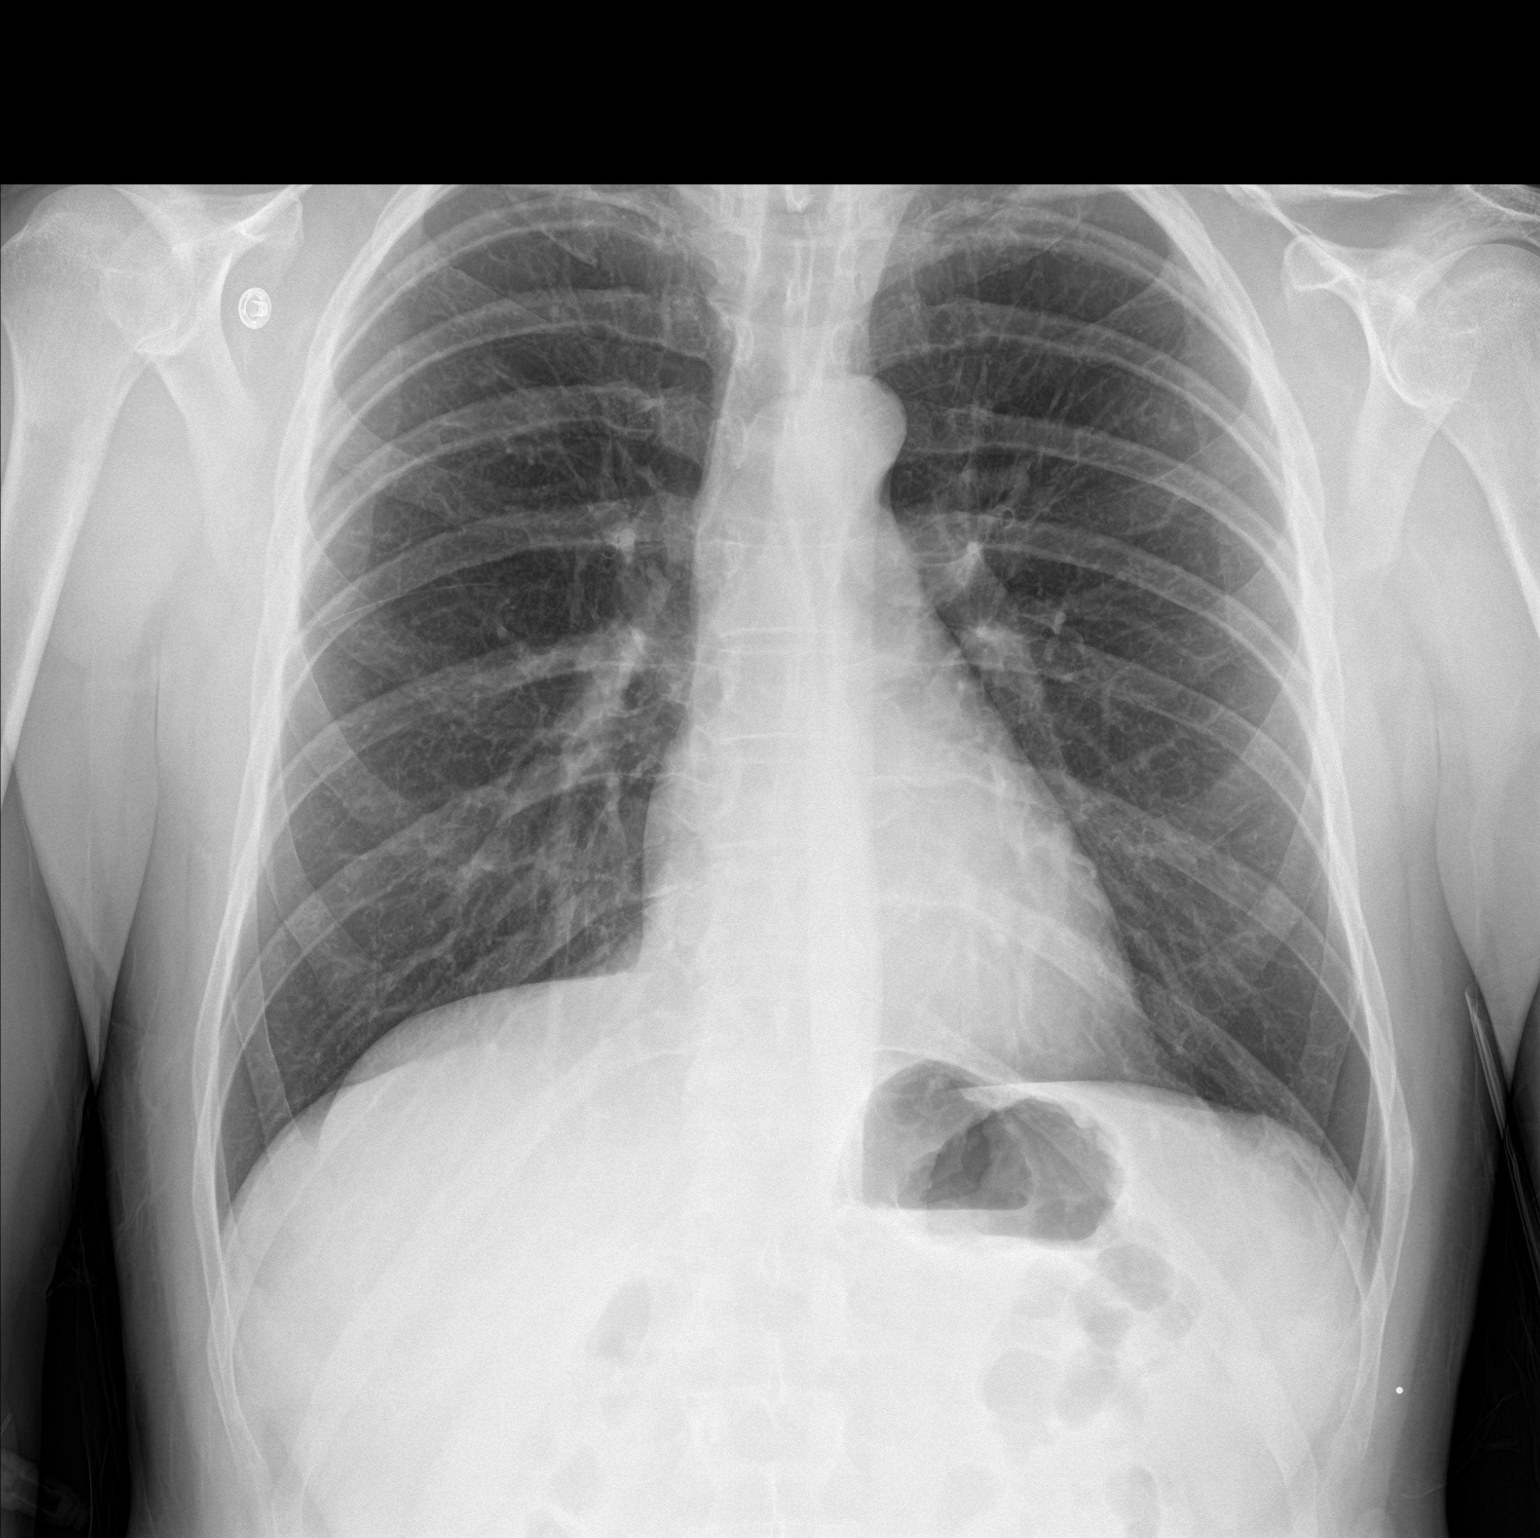

[chest lat]
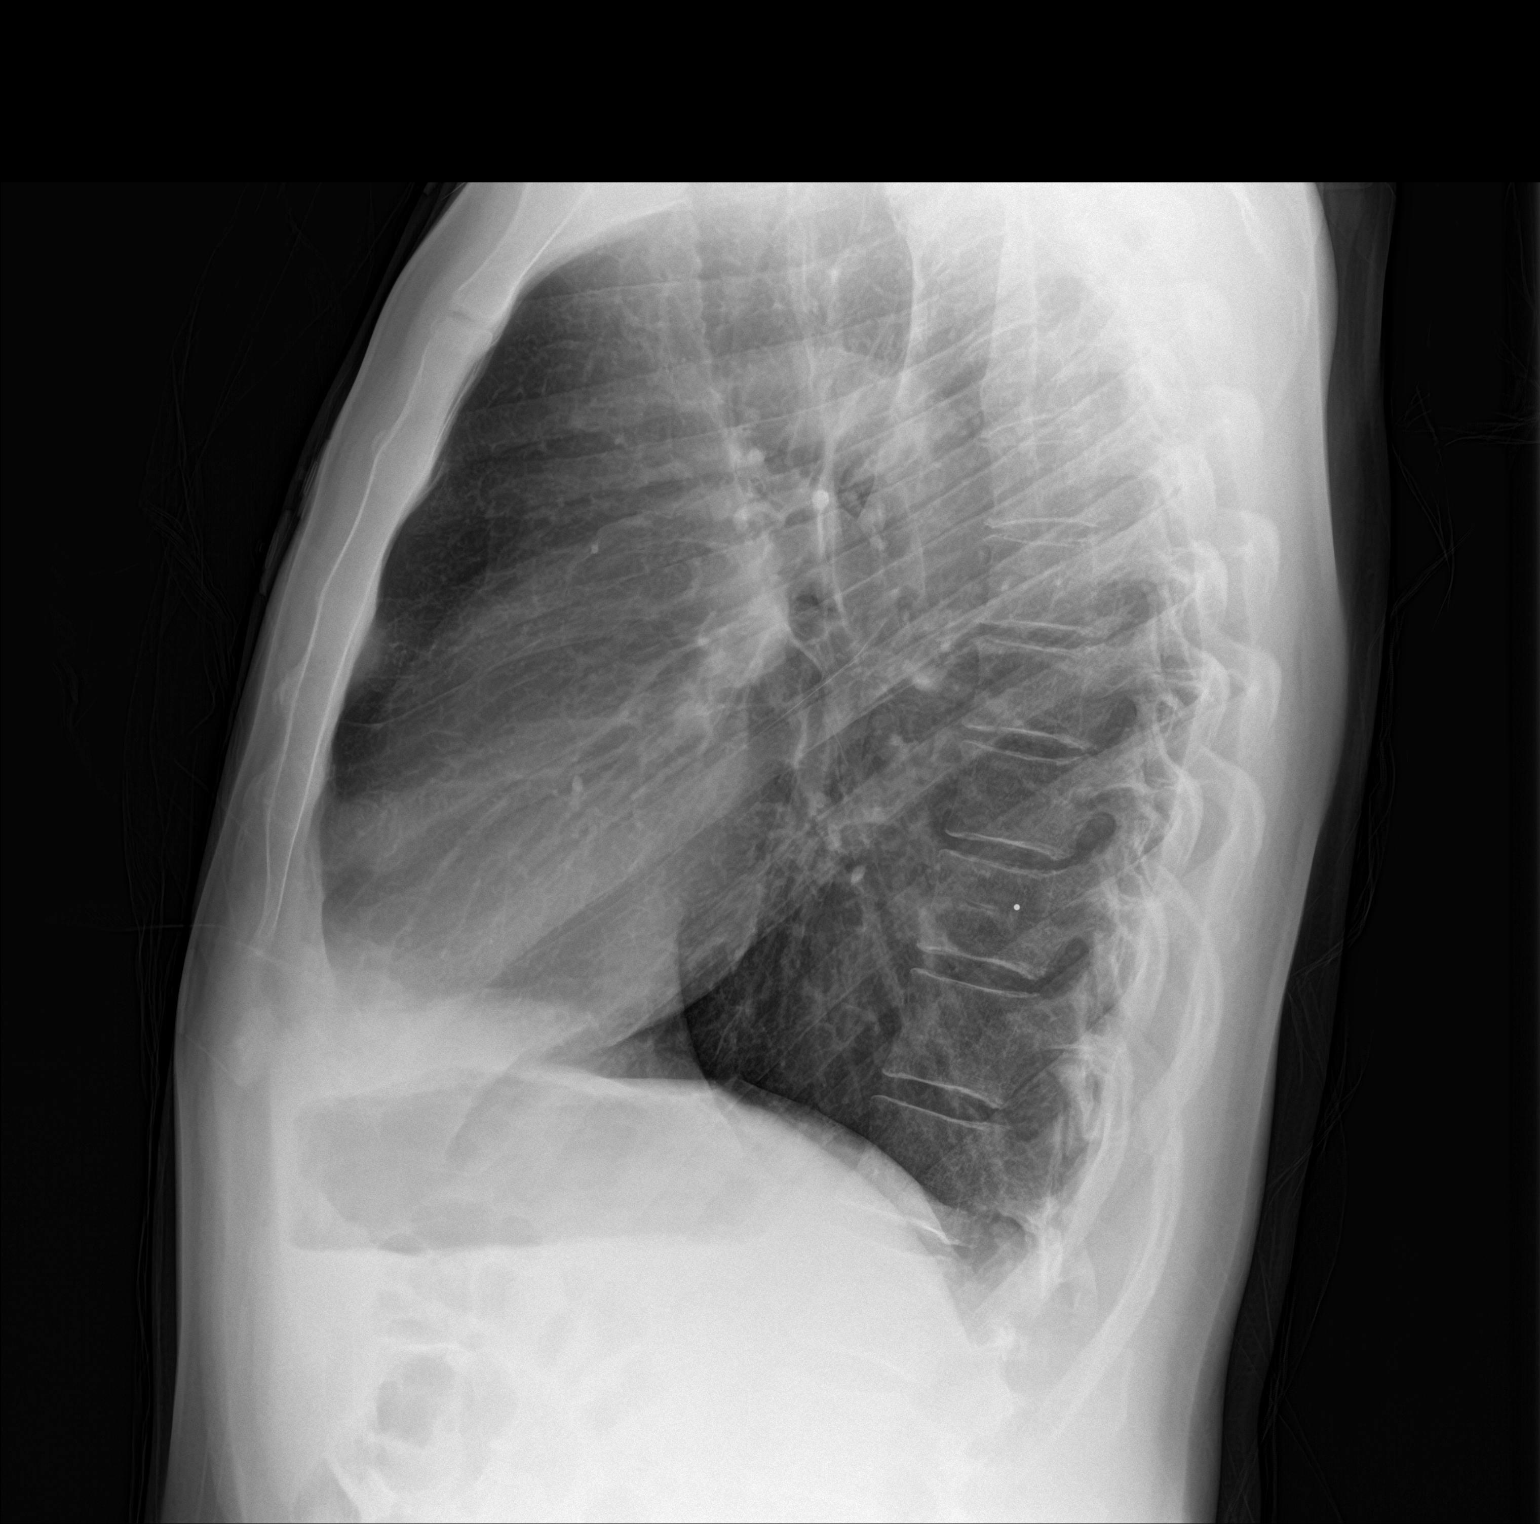

[2 of 2 positions shown; findings below may reference images not displayed]

FINDINGS: Cardiomediastinal silhouette unremarkable. Lungs clear.
Bronchovascular markings normal. Pulmonary vascularity normal. No
pneumothorax. No pleural effusions. Visualized bony thorax intact.
IMPRESSION: Normal examination.

## 2018-02-04 IMAGING — CT CT HEAD W/O CM
2 series · 16 of 30 positions shown, 20 images · non-contrast
Comparison: None.

CLINICAL DATA: Acute onset slurred speech. Right arm facial
numbness today. Headache.

EXAM:
CT HEAD WITHOUT CONTRAST
TECHNIQUE: Contiguous axial images were obtained from the base of the skull
through the vertex without intravenous contrast.

[Series 201: head w/o, idose (1) · axial · non-contrast · 0.45mm/px · z∈[+70,+190]mm · 13 of 30 slices shown, 17 images]
[im 3/30  brain]
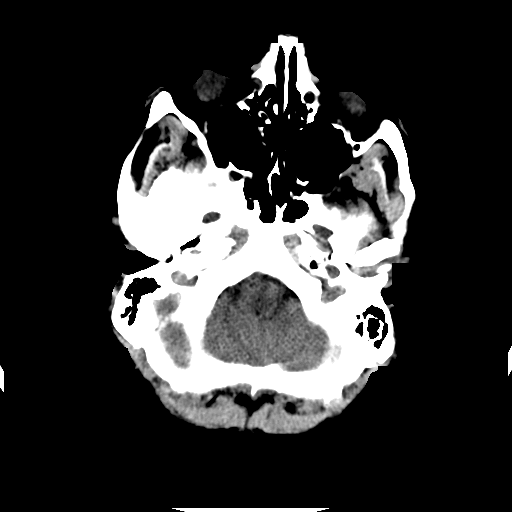
[im 3/30  bone]
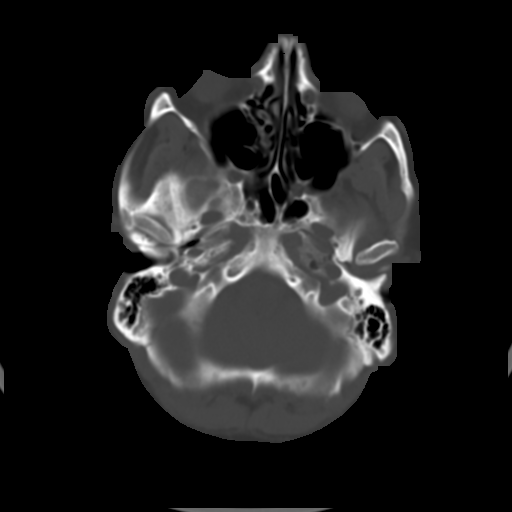
[im 5/30  brain]
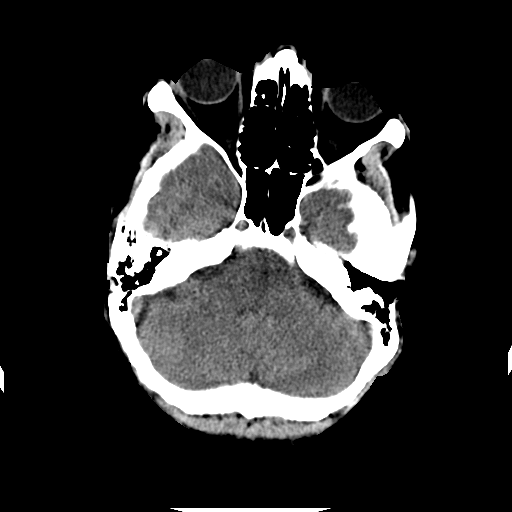
[im 7/30  brain]
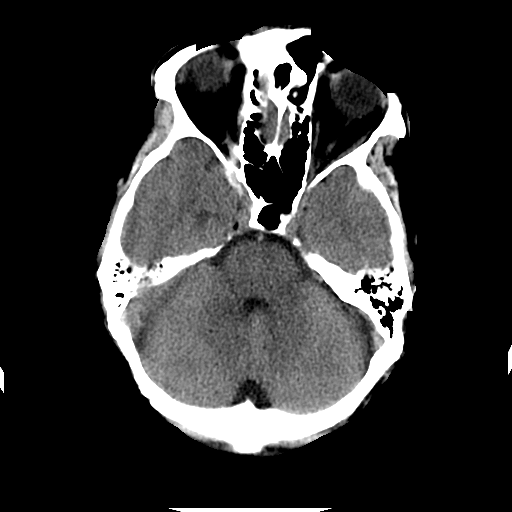
[im 9/30  brain]
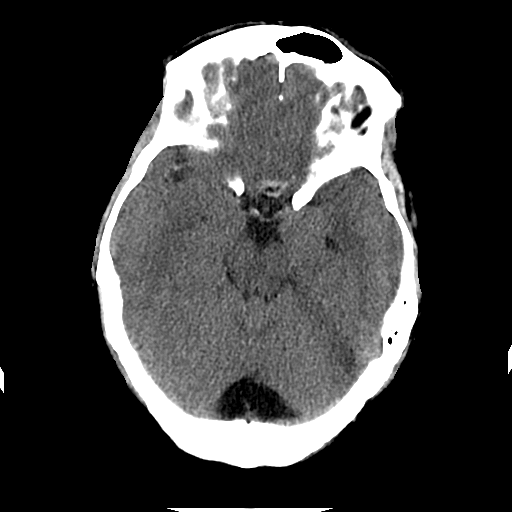
[im 11/30  brain]
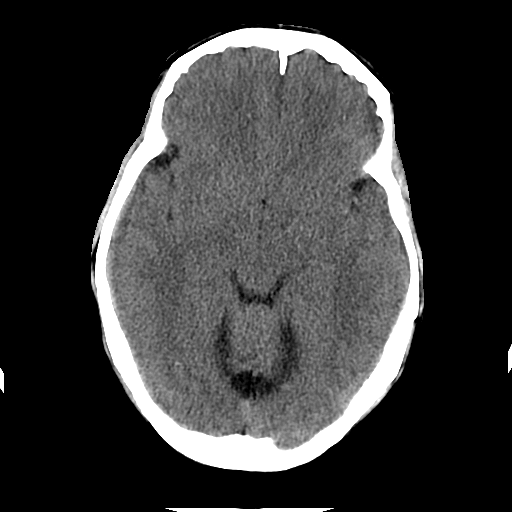
[im 11/30  bone]
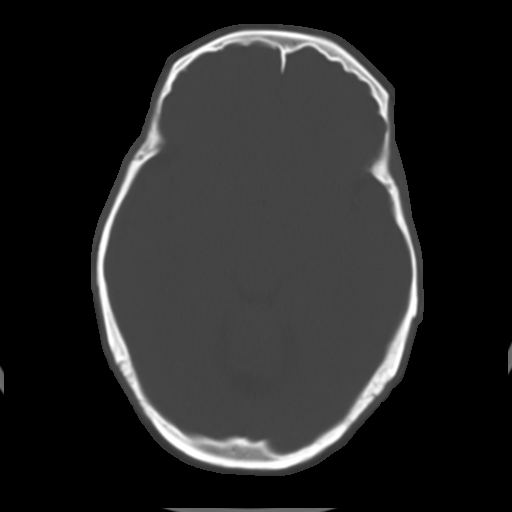
[im 13/30  brain]
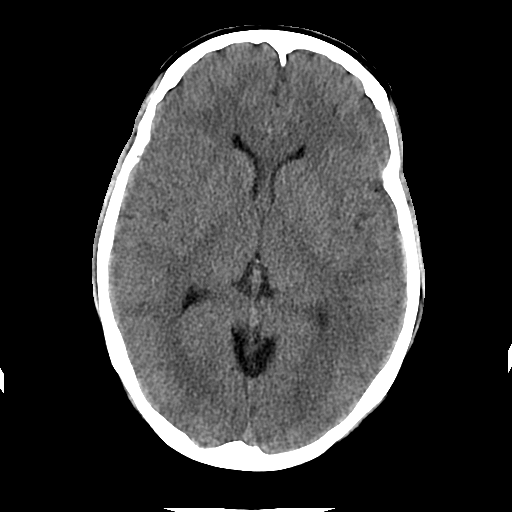
[im 15/30  brain]
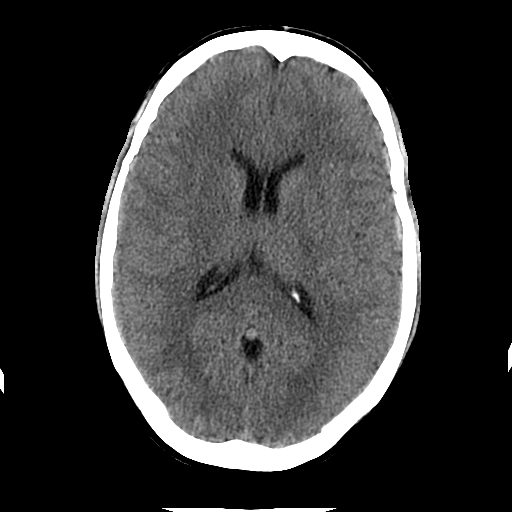
[im 17/30  brain]
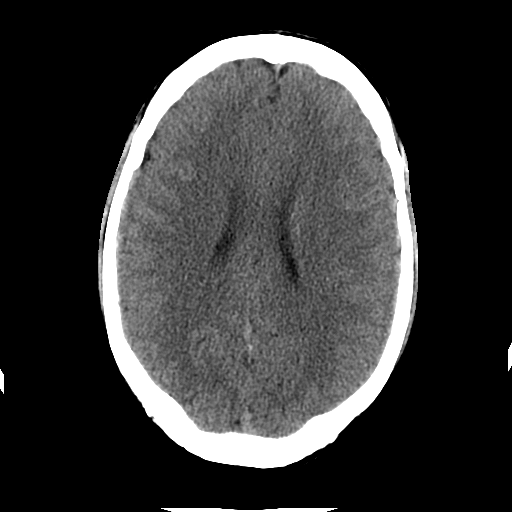
[im 19/30  brain]
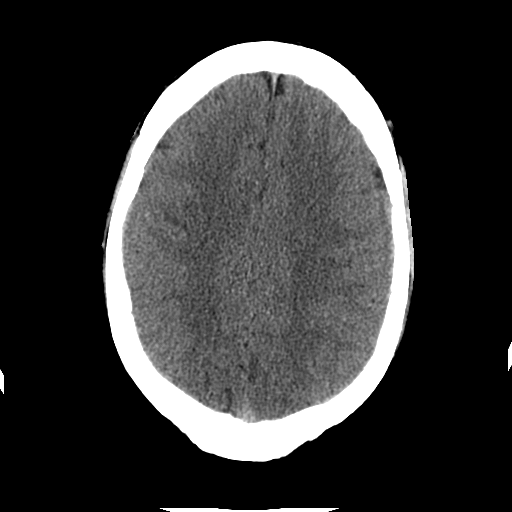
[im 19/30  bone]
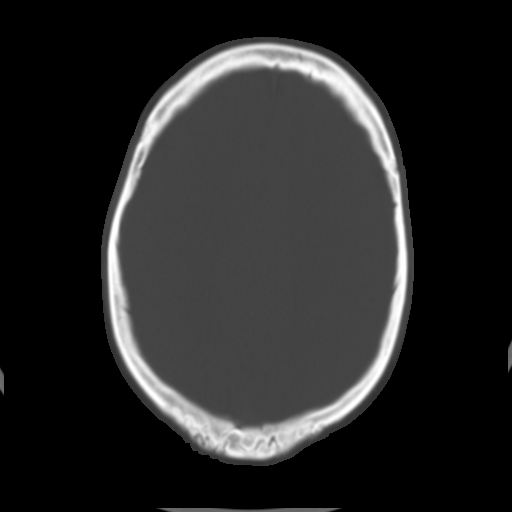
[im 21/30  brain]
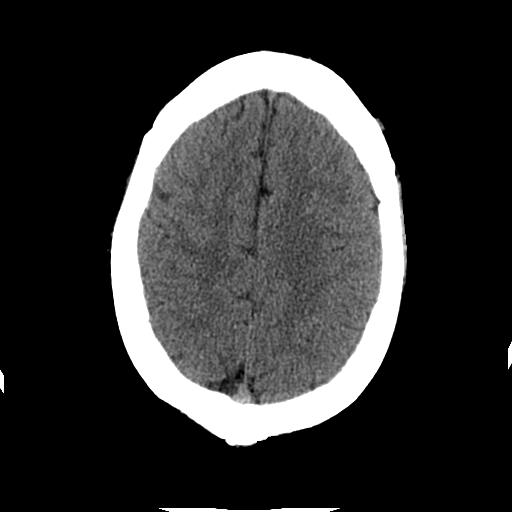
[im 23/30  brain]
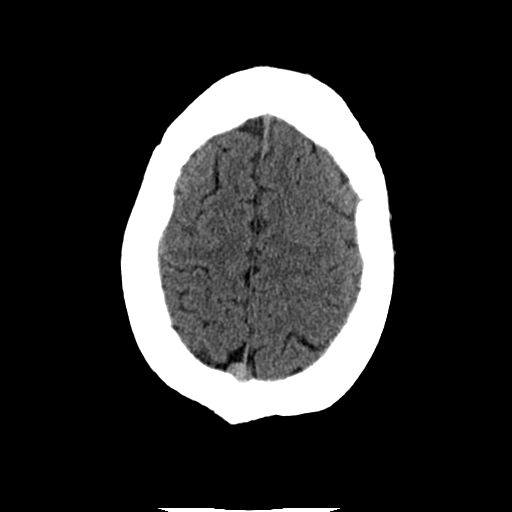
[im 25/30  brain]
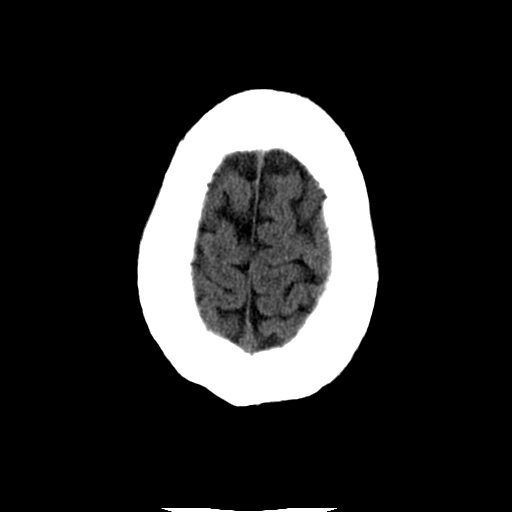
[im 27/30  brain]
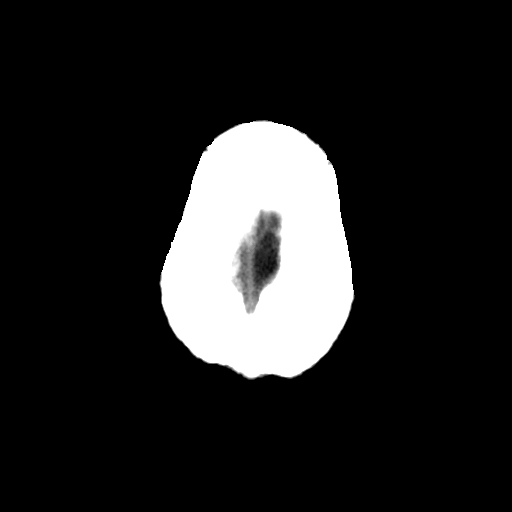
[im 27/30  bone]
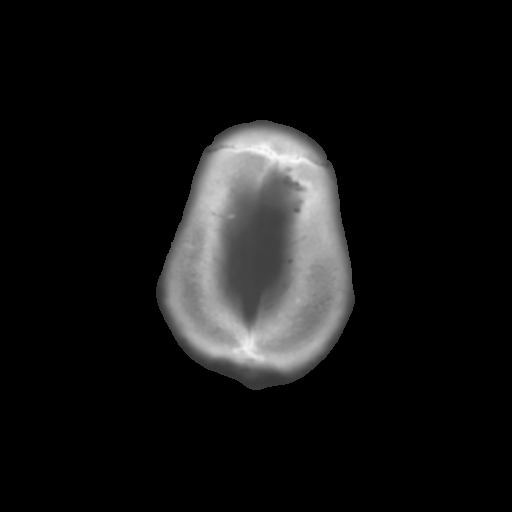

[Series 202: head w/o bone, idose (1) · axial · non-contrast · 0.45mm/px · z∈[+70,+110]mm · 3 of 30 slices shown]
[im 3/30  bone]
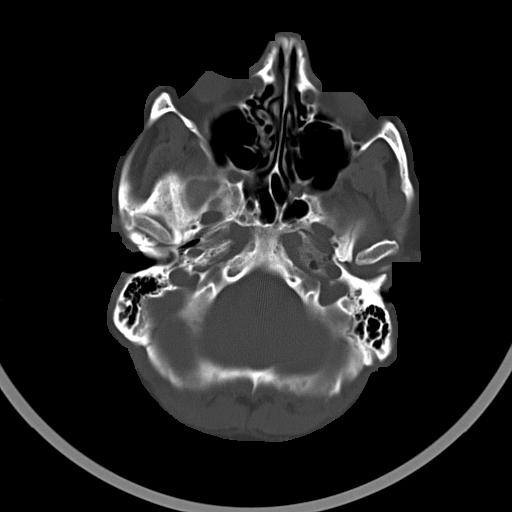
[im 7/30  bone]
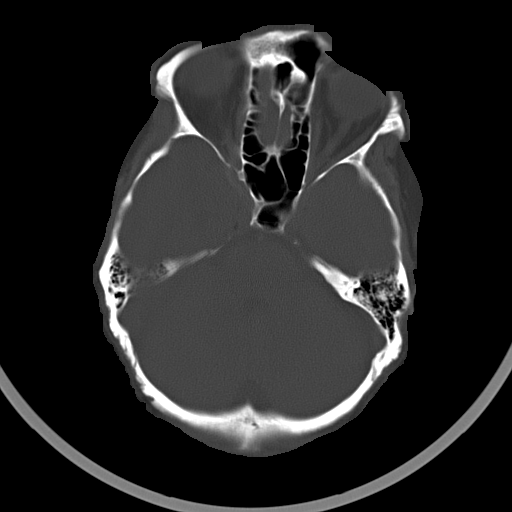
[im 11/30  bone]
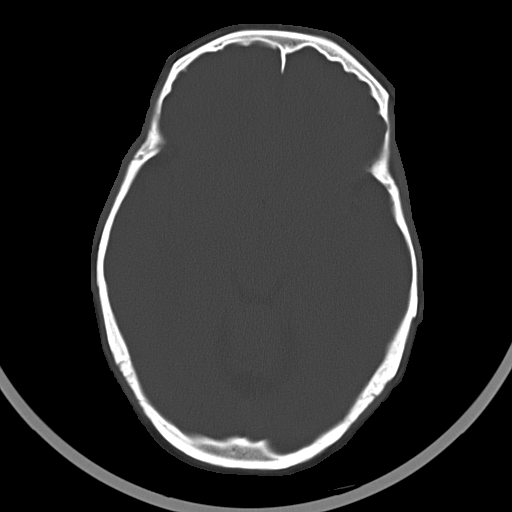

[16 of 30 positions shown; findings below may reference images not displayed]

FINDINGS: No evidence of intracranial hemorrhage, brain edema, or other signs
of acute infarction. No evidence of intracranial mass lesion or mass
effect.

No abnormal extraaxial fluid collections identified. Ventricles are
normal in size. No skull abnormality identified. Mild mucosal
thickening seen involving the right maxillary sinus.
IMPRESSION: No acute intracranial abnormality.

Mild chronic right maxillary sinusitis.

## 2018-09-18 ENCOUNTER — Encounter: Payer: Self-pay | Admitting: Gastroenterology

## 2018-09-18 ENCOUNTER — Other Ambulatory Visit (INDEPENDENT_AMBULATORY_CARE_PROVIDER_SITE_OTHER): Payer: 59

## 2018-09-18 ENCOUNTER — Ambulatory Visit: Payer: 59 | Admitting: Gastroenterology

## 2018-09-18 VITALS — BP 100/70 | HR 76 | Ht 69.0 in | Wt 180.5 lb

## 2018-09-18 DIAGNOSIS — R1032 Left lower quadrant pain: Secondary | ICD-10-CM

## 2018-09-18 DIAGNOSIS — R197 Diarrhea, unspecified: Secondary | ICD-10-CM

## 2018-09-18 DIAGNOSIS — R1011 Right upper quadrant pain: Secondary | ICD-10-CM

## 2018-09-18 LAB — CBC WITH DIFFERENTIAL/PLATELET
BASOS ABS: 0 10*3/uL (ref 0.0–0.1)
Basophils Relative: 0.9 % (ref 0.0–3.0)
EOS ABS: 0 10*3/uL (ref 0.0–0.7)
Eosinophils Relative: 0.7 % (ref 0.0–5.0)
HEMATOCRIT: 44.9 % (ref 39.0–52.0)
Hemoglobin: 15.4 g/dL (ref 13.0–17.0)
Lymphocytes Relative: 33 % (ref 12.0–46.0)
Lymphs Abs: 1.7 10*3/uL (ref 0.7–4.0)
MCHC: 34.4 g/dL (ref 30.0–36.0)
MCV: 87.1 fl (ref 78.0–100.0)
Monocytes Absolute: 0.3 10*3/uL (ref 0.1–1.0)
Monocytes Relative: 5.3 % (ref 3.0–12.0)
NEUTROS ABS: 3.1 10*3/uL (ref 1.4–7.7)
NEUTROS PCT: 60.1 % (ref 43.0–77.0)
PLATELETS: 239 10*3/uL (ref 150.0–400.0)
RBC: 5.15 Mil/uL (ref 4.22–5.81)
RDW: 12.9 % (ref 11.5–15.5)
WBC: 5.1 10*3/uL (ref 4.0–10.5)

## 2018-09-18 LAB — HEPATIC FUNCTION PANEL
ALK PHOS: 62 U/L (ref 39–117)
ALT: 26 U/L (ref 0–53)
AST: 21 U/L (ref 0–37)
Albumin: 4.6 g/dL (ref 3.5–5.2)
BILIRUBIN DIRECT: 0.1 mg/dL (ref 0.0–0.3)
TOTAL PROTEIN: 7.3 g/dL (ref 6.0–8.3)
Total Bilirubin: 0.6 mg/dL (ref 0.2–1.2)

## 2018-09-18 LAB — BASIC METABOLIC PANEL
BUN: 16 mg/dL (ref 6–23)
CHLORIDE: 105 meq/L (ref 96–112)
CO2: 29 meq/L (ref 19–32)
CREATININE: 1.15 mg/dL (ref 0.40–1.50)
Calcium: 9.7 mg/dL (ref 8.4–10.5)
GFR: 72.19 mL/min (ref >60.00)
GLUCOSE: 101 mg/dL — AB (ref 70–99)
Potassium: 4.3 mEq/L (ref 3.5–5.1)
Sodium: 139 mEq/L (ref 135–145)

## 2018-09-18 LAB — IGA: IGA: 313 mg/dL (ref 68–378)

## 2018-09-18 LAB — TSH: TSH: 1.94 u[IU]/mL (ref 0.35–4.50)

## 2018-09-18 MED ORDER — DICYCLOMINE HCL 20 MG PO TABS: 20.0000 mg | ORAL_TABLET | Freq: Three times a day (TID) | ORAL | 11 refills | Status: DC | PRN

## 2018-09-18 MED ORDER — OMEPRAZOLE 40 MG PO CPDR: 40.0000 mg | DELAYED_RELEASE_CAPSULE | Freq: Every day | ORAL | 11 refills | Status: DC

## 2018-09-18 NOTE — Patient Instructions (Signed)
Your provider has requested that you go to the basement level for lab work before leaving today. Press "B" on the elevator. The lab is located at the first door on the left as you exit the elevator.  We have sent the following medications to your pharmacy for you to pick up at your convenience:omeprazole and dicyclomine.  Patient advised to avoid spicy, acidic, citrus, chocolate, mints, fruit and fruit juices.  Limit the intake of caffeine, alcohol and Soda.  Don't exercise too soon after eating.  Don't lie down within 3-4 hours of eating.  Elevate the head of your bed.  You have been scheduled for an abdominal ultrasound at [facility] (1st floor of hospital) on 09/23/18 at 10:00am. Please arrive 15 minutes prior to your appointment for registration. Make certain not to have anything to eat or drink 6 hours prior to your appointment. Should you need to reschedule your appointment, please contact radiology at [phone]. This test typically takes about 30 minutes to perform.  Normal BMI (Body Mass Index- based on height and weight) is between 19 and 25. Your BMI today is Body mass index is 26.66 kg/m. Marland Kitchen Please consider follow up  regarding your BMI with your Primary Care Provider.  Thank you for choosing me and [facility].  Pricilla Riffle. Dagoberto Ligas., MD., [facility] Regal

## 2018-09-18 NOTE — Progress Notes (Signed)
    History of Present Illness: This is a 47 year old male complaining of RUQ abdominal pain, epigastric abdominal pain, LLQ abdominal pain, gas, bloating and intermittent, urgent, postprandial diarrhea.  Prior EGD and colonoscopy below.  He is no longer taking medications for acid reflux and notes occasional epigastric burning.  He relates abdominal pains, gas, bloating and urgent diarrhea intermittently for many years symptoms have worsened recently.  He states he had been under more stress over the past several months with his father's declining health and his father passed away earlier this month.  He has not noted any specific foods that precipitate symptoms although recently [employer] and [place] food precipitated urgent, postprandial diarrhea. Denies weight loss, constipation, change in stool caliber, melena, hematochezia, nausea, vomiting, dysphagia, chest pain.   EGD 03/2016: - LA Grade A reflux esophagitis. - Small hiatal hernia.  Colonoscopy 03/2016: - Three 6 to 8 mm polyps in the rectum and in the sigmoid colon, removed with a cold snare. Resected and retrieved. - One 4 mm polyp in the transverse colon, removed with a cold biopsy forceps. Resected and retrieved.  Current Medications, Allergies, Past Medical History, Past Surgical History, Family History and Social History were reviewed in [employer] record.  Physical Exam: General: Well developed, well nourished, no acute distress Head: Normocephalic and atraumatic Eyes:  sclerae anicteric, EOMI Ears: Normal auditory acuity Mouth: No deformity or lesions Lungs: Clear throughout to auscultation Heart: Regular rate and rhythm; no murmurs, rubs or bruits Abdomen: Soft, non tender and non distended. No masses, hepatosplenomegaly or hernias noted. Normal Bowel sounds Rectal: Not done Musculoskeletal: Symmetrical with no gross deformities  Pulses:  Normal pulses noted Extremities: No clubbing, cyanosis, edema  or deformities noted Neurological: Alert oriented x 4, grossly nonfocal Psychological:  Alert and cooperative. Normal mood and affect   Assessment and Recommendations:  1. Suspected IBS-D with abdominal pain, bloating, urgency, diarrhea. R/O food intolerances and other etiologies. Abd [place], CMET, CBC, TSH, tTG, IgA, GI pathogen panel.  Trial of dicyclomine 20 mg 3 times daily AC.  Begin a low gas diet.  Take note of foods that exacerbate diarrhea and avoid.  REV in 6 weeks.   2. GERD with erosive esophagitis. Likely cause of intermittent epigastric burning pain.  Resume omeprazole 40 mg po qam and antireflux measures.   3. RUQ pain is localized to his right lateral lower ribs which may not be GI related. Abd [place].   4.  Personal history of adenomatous colon polyps.  A 5-year interval surveillance colonoscopy is recommended in May 2022.

## 2018-09-19 ENCOUNTER — Other Ambulatory Visit: Payer: 59

## 2018-09-19 DIAGNOSIS — R197 Diarrhea, unspecified: Secondary | ICD-10-CM

## 2018-09-19 DIAGNOSIS — R1032 Left lower quadrant pain: Secondary | ICD-10-CM

## 2018-09-19 DIAGNOSIS — R1011 Right upper quadrant pain: Secondary | ICD-10-CM

## 2018-09-19 LAB — TISSUE TRANSGLUTAMINASE, IGA: (tTG) Ab, IgA: 1 U/mL

## 2018-09-23 ENCOUNTER — Ambulatory Visit (HOSPITAL_COMMUNITY)
Admission: RE | Admit: 2018-09-23 | Discharge: 2018-09-23 | Disposition: A | Discharge status: Home / Self Care | Payer: 59 | Source: Ambulatory Visit | Attending: Gastroenterology | Admitting: Gastroenterology

## 2018-09-23 DIAGNOSIS — N281 Cyst of kidney, acquired: Secondary | ICD-10-CM | POA: Insufficient documentation

## 2018-09-23 DIAGNOSIS — R1032 Left lower quadrant pain: Secondary | ICD-10-CM | POA: Diagnosis not present

## 2018-09-23 DIAGNOSIS — R1011 Right upper quadrant pain: Secondary | ICD-10-CM | POA: Diagnosis present

## 2018-09-23 DIAGNOSIS — R197 Diarrhea, unspecified: Secondary | ICD-10-CM | POA: Insufficient documentation

## 2018-09-24 ENCOUNTER — Telehealth: Payer: Self-pay

## 2018-09-24 LAB — GASTROINTESTINAL PATHOGEN PANEL PCR
C. DIFFICILE TOX A/B, PCR: NOT DETECTED
Campylobacter, PCR: NOT DETECTED
Cryptosporidium, PCR: NOT DETECTED
E COLI (ETEC) LT/ST, PCR: NOT DETECTED
E COLI (STEC) STX1/STX2, PCR: NOT DETECTED
E coli 0157, PCR: NOT DETECTED
Giardia lamblia, PCR: NOT DETECTED
Norovirus, PCR: NOT DETECTED
ROTAVIRUS, PCR: NOT DETECTED
Salmonella, PCR: NOT DETECTED
Shigella, PCR: NOT DETECTED

## 2018-09-24 NOTE — Telephone Encounter (Addendum)
Attempted to reach pt via home phone-unable to leave a message due to mailbox being full; will attempt to reach pt at a later time;  Pt has been scheduled with Dr. Gloriann Loan at [facility] ([phone]) on 10/29/18 appt time 2:45 pm

## 2018-09-24 NOTE — Telephone Encounter (Signed)
Pt returned call to office- informed pt of abdominal ultrasound results and MD recommendations-pt is in agreement to proceed with referral to [facility]-pt has been scheduled with Dr. Gloriann Loan at 2:45 pm on 10/29/18 at [facility];  Pt also advised to call back if questions/concerns arise; pt verbalized understanding of recommendations and of need for appt with urology to be kept;

## 2024-04-17 ENCOUNTER — Other Ambulatory Visit: Payer: Self-pay | Admitting: *Deleted

## 2024-04-17 ENCOUNTER — Encounter: Payer: Self-pay | Admitting: Physician Assistant

## 2024-04-17 ENCOUNTER — Ambulatory Visit: Admitting: Physician Assistant

## 2024-04-17 ENCOUNTER — Ambulatory Visit: Payer: Self-pay | Admitting: Physician Assistant

## 2024-04-17 VITALS — BP 122/75 | HR 80 | Ht 71.0 in | Wt 194.8 lb

## 2024-04-17 DIAGNOSIS — Z125 Encounter for screening for malignant neoplasm of prostate: Secondary | ICD-10-CM

## 2024-04-17 DIAGNOSIS — R0789 Other chest pain: Secondary | ICD-10-CM | POA: Insufficient documentation

## 2024-04-17 DIAGNOSIS — R251 Tremor, unspecified: Secondary | ICD-10-CM | POA: Diagnosis not present

## 2024-04-17 DIAGNOSIS — D170 Benign lipomatous neoplasm of skin and subcutaneous tissue of head, face and neck: Secondary | ICD-10-CM

## 2024-04-17 DIAGNOSIS — E538 Deficiency of other specified B group vitamins: Secondary | ICD-10-CM | POA: Insufficient documentation

## 2024-04-17 DIAGNOSIS — G935 Compression of brain: Secondary | ICD-10-CM | POA: Diagnosis not present

## 2024-04-17 DIAGNOSIS — Z8673 Personal history of transient ischemic attack (TIA), and cerebral infarction without residual deficits: Secondary | ICD-10-CM | POA: Insufficient documentation

## 2024-04-17 LAB — COMPREHENSIVE METABOLIC PANEL WITH GFR
ALT: 30 U/L (ref 0–53)
AST: 23 U/L (ref 0–37)
Albumin: 4.8 g/dL (ref 3.5–5.2)
Alkaline Phosphatase: 79 U/L (ref 39–117)
BUN: 22 mg/dL (ref 6–23)
CO2: 27 meq/L (ref 19–32)
Calcium: 9.6 mg/dL (ref 8.4–10.5)
Chloride: 104 meq/L (ref 96–112)
Creatinine, Ser: 1.08 mg/dL (ref 0.40–1.50)
GFR: 78.39 mL/min (ref >60.00)
Glucose, Bld: 101 mg/dL — ABNORMAL HIGH (ref 70–99)
Potassium: 4.3 meq/L (ref 3.5–5.1)
Sodium: 139 meq/L (ref 135–145)
Total Bilirubin: 0.7 mg/dL (ref 0.2–1.2)
Total Protein: 7.3 g/dL (ref 6.0–8.3)

## 2024-04-17 LAB — CBC WITH DIFFERENTIAL/PLATELET
Basophils Absolute: 0 10*3/uL (ref 0.0–0.1)
Basophils Relative: 0.6 % (ref 0.0–3.0)
Eosinophils Absolute: 0 10*3/uL (ref 0.0–0.7)
Eosinophils Relative: 0.7 % (ref 0.0–5.0)
HCT: 45.1 % (ref 39.0–52.0)
Hemoglobin: 15.1 g/dL (ref 13.0–17.0)
Lymphocytes Relative: 29.2 % (ref 12.0–46.0)
Lymphs Abs: 1.4 10*3/uL (ref 0.7–4.0)
MCHC: 33.5 g/dL (ref 30.0–36.0)
MCV: 87.2 fl (ref 78.0–100.0)
Monocytes Absolute: 0.3 10*3/uL (ref 0.1–1.0)
Monocytes Relative: 5.7 % (ref 3.0–12.0)
Neutro Abs: 3 10*3/uL (ref 1.4–7.7)
Neutrophils Relative %: 63.8 % (ref 43.0–77.0)
Platelets: 246 10*3/uL (ref 150.0–400.0)
RBC: 5.17 Mil/uL (ref 4.22–5.81)
RDW: 12.9 % (ref 11.5–15.5)
WBC: 4.7 10*3/uL (ref 4.0–10.5)

## 2024-04-17 LAB — LIPID PANEL
Cholesterol: 224 mg/dL — ABNORMAL HIGH (ref 0–200)
HDL: 40.2 mg/dL (ref >39.00)
LDL Cholesterol: 160 mg/dL — ABNORMAL HIGH (ref 0–99)
NonHDL: 183.53
Total CHOL/HDL Ratio: 6
Triglycerides: 118 mg/dL (ref 0.0–149.0)
VLDL: 23.6 mg/dL (ref 0.0–40.0)

## 2024-04-17 LAB — VITAMIN B12: Vitamin B-12: 162 pg/mL — ABNORMAL LOW (ref 211–911)

## 2024-04-17 LAB — TSH: TSH: 1.68 u[IU]/mL (ref 0.35–5.50)

## 2024-04-17 LAB — PSA: PSA: 1.25 ng/mL (ref 0.10–4.00)

## 2024-04-17 LAB — HEMOGLOBIN A1C: Hgb A1c MFr Bld: 6.1 % (ref 4.6–6.5)

## 2024-04-17 MED ORDER — ROSUVASTATIN CALCIUM 20 MG PO TABS: 20.0000 mg | ORAL_TABLET | Freq: Every day | ORAL | 3 refills | Status: AC

## 2024-04-17 NOTE — Assessment & Plan Note (Signed)
 Appears as an essential/intentional tremor, but fairly pronounced. No tremor at rest.  Given young age, history of TIA, history of Chiari malformation, referring to neuro

## 2024-04-17 NOTE — Assessment & Plan Note (Signed)
 2017. No residual symptoms aside from pt reported 'speech issues'. Nothing appreciated during visit.  Recommending starting back on asa 81 mg . Ordering lipids.  Referring to neuro given tremors as well

## 2024-04-17 NOTE — Assessment & Plan Note (Signed)
 EKG normal sinus, stable in comparison to 2017 EKG on chart.  Refer to cardio pending labs

## 2024-04-17 NOTE — Progress Notes (Signed)
 New patient visit   Patient: Thomas Jacobs   DOB: 1971/07/23   53 y.o. Male  MRN: [id number] Visit Date: 04/17/2024  Today's healthcare provider: Trenton Frock, PA-C   Cc. New pt, several concerns  Subjective    Thomas Jacobs is a 53 y.o. male who presents today as a new patient to establish care.  Discussed the use of AI scribe software for clinical note transcription with the patient, who gave verbal consent to proceed.  History of Present Illness   Thomas Jacobs is a 53 year old male who presents with worsening tremors and concerns about swollen lymph nodes.  Tremors have progressively worsened since 2010-2011, initially affecting fine motor tasks and now more pronounced in both arms. A warm sensation precedes the tremors, which interfere with daily activities like holding a cup. No family history of tremors or Parkinson's disease. 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 Denies any numbness or paresthesias.  He has a history of a TIA in 2017 with temporary right-sided weakness and speech difficulty. He reports only residual symptoms is occassional speech issues. He was on statin, blood thinner, but upon noticing rectal bleeding d/c all meds. Reports colonoscopy w/ polyps since then. Denies ever re-starting blood thinners.  Intermittent dizziness is present, pt denies any positional nature. Reports when he raises his right arm above his head he will lose bloodflow to that arm.  Pt also reports swelling and tenderness in the lymph nodes behind the jaw, especially on the right side, have been noted. The swelling is painful for about 20 minutes and occurs on both sides, with the right side more affected. He experiences a tickling sensation in the ears, particularly at night, and occasional deep soreness in the right ear. No cold symptoms, ear pain, or congestion. He also appreciates a mass at the back of his head, sometimes  tender.  Pt also reports intermittent left sided pressure/tightness. Unable to identify a pattern.   He is active, coaching youth sports, and expresses concern about not having had a recent prostate exam, noting rectal pressure and infrequent bowel movements.   Past Medical History:  Diagnosis Date   Hiatal hernia    High cholesterol    Occasional tremors    TIA (transient ischemic attack)    Tubular adenoma of colon 03/2016   Past Surgical History:  Procedure Laterality Date   INGUINAL HERNIA REPAIR Bilateral 1994   Family Status  Relation Name Status   Mother  Alive   Father  Deceased   Neg Hx  (Not Specified)  No partnership data on file   Family History  Problem Relation Age of Onset   Cancer Father        liver   Stroke Neg Hx    Social History   Socioeconomic History   Marital status: Married    Spouse name: Melissa   Number of children: 2   Years of education: 12   Highest education level: Not on file  Occupational History   Occupation: [employer]  Tobacco Use   Smoking status: Never   Smokeless tobacco: Never  Substance and Sexual Activity   Alcohol use: No    Alcohol/week: 0.0 standard drinks of alcohol   Drug use: No   Sexual activity: Not on file  Other Topics Concern   Not on file  Social History Narrative   Lives and wife and kids (2 boys, 11 and 20)   Caffeine  use: coffee- 1 cup per day (decaf)   Social Drivers of Corporate investment banker Strain: Not on file  Food Insecurity: Not on file  Transportation Needs: Not on file  Physical Activity: Not on file  Stress: Not on file  Social Connections: Unknown (04/01/2022)   Received from [facility]   Social Network    Social Network: Not on file   Outpatient Medications Prior to Visit  Medication Sig   aspirin  EC 81 MG tablet Take 81 mg by mouth daily. Swallow whole.   [DISCONTINUED] dicyclomine  (BENTYL ) 20 MG tablet Take 1 tablet (20 mg total) by mouth 3 (three) times daily with meals as  needed for spasms.   [DISCONTINUED] omeprazole  (PRILOSEC) 40 MG capsule Take 1 capsule (40 mg total) by mouth daily.   No facility-administered medications prior to visit.   No Known Allergies   There is no immunization history on file for this patient.  Health Maintenance  Topic Date Due   HIV Screening  Never done   Hepatitis C Screening  Never done   DTaP/Tdap/Td (1 - Tdap) Never done   Zoster Vaccines- Shingrix (1 of 2) Never done   Colonoscopy  04/03/2021   COVID-19 Vaccine (1 - 2024-25 season) Never done   INFLUENZA VACCINE  06/19/2024   HPV VACCINES  Aged Out   Meningococcal B Vaccine  Aged Out    Patient Care Team: Trenton Frock, PA-C as PCP - General (Physician Assistant)  Review of Systems  Constitutional:  Negative for fatigue and fever.  Respiratory:  Negative for cough and shortness of breath.   Cardiovascular:  Positive for chest pain. Negative for palpitations and leg swelling.  Neurological:  Positive for dizziness and tremors. Negative for headaches.        Objective    BP 122/75   Pulse 80   Ht 5\' 11"  (1.803 m)   Wt 194 lb 12.8 oz (88.4 kg)   BMI 27.17 kg/m     Physical Exam Constitutional:      General: He is awake.     Appearance: He is well-developed.  HENT:     Head: Normocephalic.     Comments: Right occipital skull there is a soft, slightly mobile mass? ~ 1 cm or less Consistent w/ a small lipoma    Right Ear: Tympanic membrane normal.     Left Ear: Tympanic membrane normal.  Eyes:     Conjunctiva/sclera: Conjunctivae normal.  Cardiovascular:     Rate and Rhythm: Normal rate and regular rhythm.     Heart sounds: Normal heart sounds.  Pulmonary:     Effort: Pulmonary effort is normal.     Breath sounds: Normal breath sounds.  Lymphadenopathy:     Cervical: No cervical adenopathy.  Skin:    General: Skin is warm.  Neurological:     Mental Status: He is alert and oriented to person, place, and time.  Psychiatric:         Attention and Perception: Attention normal.        Mood and Affect: Mood normal.        Speech: Speech normal.        Behavior: Behavior is cooperative.    Depression Screen    04/17/2024    8:22 AM  PHQ 2/9 Scores  PHQ - 2 Score 0   No results found for any visits on 04/17/24.  Assessment & Plan     Tremor Assessment & Plan: Appears as an essential/intentional tremor, but  fairly pronounced. No tremor at rest.  Given young age, history of TIA, history of Chiari malformation, referring to neuro  Orders: -     Hemoglobin A1c -     TSH -     Vitamin B12 -     Ambulatory referral to Neurology  History of TIA (transient ischemic attack) Assessment & Plan: 2017. No residual symptoms aside from pt reported 'speech issues'. Nothing appreciated during visit.  Recommending starting back on asa 81 mg . Ordering lipids.  Referring to neuro given tremors as well  Orders: -     CBC with Differential/Platelet -     Comprehensive metabolic panel with GFR -     Lipid panel -     Hemoglobin A1c -     Ambulatory referral to Neurology -     Lipoprotein A (LPA)  Chest pressure Assessment & Plan: EKG normal sinus, stable in comparison to 2017 EKG on chart.  Refer to cardio pending labs  Orders: -     EKG 12-Lead  Chiari I malformation [facility]) Assessment & Plan: Seen on imaging from TIA , no f/u as far as I can tell. No HA mentioned, but intermittent dizziness.  MRI 12/2015 IMPRESSION: 1. No acute intracranial infarct or other process identified. 2. 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 3. Otherwise normal brain MRI.  Orders: -     Ambulatory referral to Neurology  Prostate cancer screening -     PSA  Lipoma of head     Return if symptoms worsen or fail to improve, for f/u pending labs.      Trenton Frock, PA-C  [facility] [phone] (phone) [phone] (fax)  [facility]

## 2024-04-17 NOTE — Assessment & Plan Note (Signed)
 Seen on imaging from TIA , no f/u as far as I can tell. No HA mentioned, but intermittent dizziness.  MRI 12/2015 IMPRESSION: 1. No acute intracranial infarct or other process identified. 2. 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 3. Otherwise normal brain MRI.

## 2024-04-20 LAB — LIPOPROTEIN A (LPA): Lipoprotein (a): 148 nmol/L — ABNORMAL HIGH (ref <75)

## 2024-04-21 ENCOUNTER — Encounter: Payer: Self-pay | Admitting: Neurology

## 2024-04-30 NOTE — Progress Notes (Signed)
 Assessment/Plan:   1.  Essential Tremor, moderate.  -This is evidenced by the symmetrical nature and longstanding hx of gradually getting worse.  We discussed nature and pathophysiology.  We discussed that this can continue to gradually get worse with time.  We discussed that some medications can worsen this, as can caffeine use.  We discussed medication therapy as well as surgical therapy.  Ultimately, the patient decided to try low-dose primidone and work to 50 mg twice per day.   R/b/se discussed with patient and understanding expressed  2.  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  Discussed nature and pathophysiology.  At this point in time, because of intermittent nature, did not recommend treatment.  3.  History of TIA, 2017  - Treatment with [facility] at the time  Subjective:   Thomas Jacobs was seen today in the movement disorders clinic for neurologic consultation at the request of Drubel, Heidi Llamas, PA-C.  The consultation is for the evaluation of tremor x 15 years.  Patient has previously been seen by [facility] neurology, Dr. Tresia Fruit, back in 2017 for symptoms of transient speech difficulty and right face numbness, felt to represent a possible TIA.  At that point in time, patient did not have documented tremor on her examination.  He reports tremor since 2010.  Tremor in both hands but the L>R.  It started in the L hand but now in the R.  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     How long has it been going on? 2010  At rest or with activation?  Activation  When is it noted the most?  eating, drinking, putting in contacts.  Doesn't have problem with typing, working at computer, working with mouse  Fam hx of tremor?  No.  Located where?  Bilateral upper extremities  Affected by caffeine:  No. (1 large cup coffee/day; has decreased sweet tea)  Affected by alcohol:  doesn't drink  Affected by  stress:  No.  Affected by fatigue:  No.  Spills soup if on spoon:  he grabs it with 2 hands  Affects ADL's (tying shoes, brushing teeth, etc):  No. (May have mild trouble with shaving)  Other Specific Symptoms:  Voice: no change Postural symptoms:  No.  Falls?  No. Loss of smell:  No. Loss of taste:  No. Urinary Incontinence:  No. Difficulty Swallowing:  No. N/V:  No. Lightheaded:  No.  Syncope: No. Diplopia:  No.  Last neuroimaging of the brain was when he had TIA in 2017  Also, c/o intermittent sharp pain at the base of the neck/in skull.  It may last 10-15 min.  Its sharp in nature.      ALLERGIES:  No Known Allergies  CURRENT MEDICATIONS:  Current Outpatient Medications  Medication Instructions   cyanocobalamin  (VITAMIN B12) 1,000 mcg, Daily   rosuvastatin  (CRESTOR ) 20 mg, Oral, Daily    Objective:   PHYSICAL EXAMINATION:    VITALS:   Vitals:   05/05/24 0949  BP: 120/82  Pulse: 72  SpO2: 96%  Weight: 194 lb 3.2 oz (88.1 kg)    GEN:  The patient appears stated age and is in NAD. HEENT:  Normocephalic, atraumatic.  The mucous membranes are moist. The superficial temporal arteries are without ropiness or tenderness. CV:  RRR Lungs:  CTAB Neck/HEME:  There are no carotid bruits bilaterally.  Neurological examination:  Orientation: The patient is alert  and oriented x3.  Cranial nerves: There is good facial symmetry.  Extraocular muscles are intact. The visual fields are full to confrontational testing. The speech is fluent and clear. Soft palate rises symmetrically and there is no tongue deviation. Hearing is intact to conversational tone. Sensation: Sensation is intact to light touch throughout (facial, trunk, extremities). Vibration is intact at the bilateral big toe. There is no extinction with double simultaneous stimulation.  Motor: Strength is 5/5 in the bilateral upper and lower extremities.   Shoulder shrug is equal and symmetric.  There is no pronator  drift. Deep tendon reflexes: Deep tendon reflexes are 2/4 at the bilateral biceps, triceps, brachioradialis, patella and achilles. Plantar responses are downgoing bilaterally.  Movement examination: Tone: There is nl tone in the bilateral upper extremities.  The tone in the lower extremities is nl.  Abnormal movements: there is no rest tremor.  There is mild postural tremor and mod intention tremor b/l, L>R.  He has trouble pouring water from 1 glass to another, especially when the full glass is in the left hand, but generally does not spill very much.  He has clear tremor with Archimedes spirals bilaterally.     Coordination:  There is no decremation with RAM's, with any form of RAMS, including alternating supination and pronation of the forearm, hand opening and closing, finger taps, heel taps and toe taps.  Gait and Station: The patient has no difficulty arising out of a deep-seated chair without the use of the hands. The patient's stride length is good.   I have reviewed and interpreted the following labs independently   Chemistry      Component Value Date/Time   NA 139 04/17/2024 0938   K 4.3 04/17/2024 0938   CL 104 04/17/2024 0938   CO2 27 04/17/2024 0938   BUN 22 04/17/2024 0938   CREATININE 1.08 04/17/2024 0938   CREATININE 1.03 02/20/2016 1552      Component Value Date/Time   CALCIUM  9.6 04/17/2024 0938   ALKPHOS 79 04/17/2024 0938   AST 23 04/17/2024 0938   ALT 30 04/17/2024 0938   BILITOT 0.7 04/17/2024 0938      Lab Results  Component Value Date   TSH 1.68 04/17/2024   Lab Results  Component Value Date   WBC 4.7 04/17/2024   HGB 15.1 04/17/2024   HCT 45.1 04/17/2024   MCV 87.2 04/17/2024   PLT 246.0 04/17/2024      Total time spent on today's visit was 60 minutes, including both face-to-face time and nonface-to-face time.  Time included that spent on review of records (prior notes available to me/labs/imaging if pertinent), discussing treatment and goals,  answering patient's questions and coordinating care.  Cc:  Trenton Frock, PA-C

## 2024-05-05 ENCOUNTER — Encounter: Payer: Self-pay | Admitting: Neurology

## 2024-05-05 ENCOUNTER — Ambulatory Visit: Admitting: Neurology

## 2024-05-05 VITALS — BP 120/82 | HR 72 | Wt 194.2 lb

## 2024-05-05 DIAGNOSIS — G250 Essential tremor: Secondary | ICD-10-CM

## 2024-05-05 DIAGNOSIS — M5481 Occipital neuralgia: Secondary | ICD-10-CM

## 2024-05-05 MED ORDER — PRIMIDONE 50 MG PO TABS: 50.0000 mg | ORAL_TABLET | Freq: Two times a day (BID) | ORAL | 1 refills | Status: AC

## 2024-05-05 NOTE — Patient Instructions (Addendum)
 Start primidone 50 mg - 1/2 tablet at bedtime for 1 week and then increase to 1 tablet at bedtime x 1 week and then increase to 1 tablet twice per day thereafter  Essential Tremor A tremor is trembling or shaking that a person cannot control. Most tremors affect the hands or arms. Tremors can also affect the head, vocal cords, legs, and other parts of the body. Essential tremor is a tremor without a known cause. Usually, it occurs while a person is trying to perform an action. It tends to get worse gradually as a person ages. What are the causes? The cause of this condition is not known, but it often runs in families. What increases the risk? You are more likely to develop this condition if: You have a family member with essential tremor. You are 54 years of age or older. What are the signs or symptoms? The main sign of a tremor is a rhythmic shaking of certain parts of your body that is uncontrolled and unintentional. You may: Have difficulty eating with a spoon or fork. Have difficulty writing. Nod your head up and down or side to side. Have a quivering voice. The shaking may: Get worse over time. Come and go. Be more noticeable on one side of your body. Get worse due to stress, tiredness (fatigue), caffeine, and extreme heat or cold. How is this diagnosed? This condition may be diagnosed based on: Your symptoms and medical history. A physical exam. There is no single test to diagnose an essential tremor. However, your health care provider may order tests to rule out other causes of your condition. These may include: Blood and urine tests. Imaging studies of your brain, such as a CT scan or MRI. How is this treated? Treatment for essential tremor depends on the severity of the condition. Mild tremors may not need treatment if they do not affect your day-to-day life. Severe tremors may need to be treated using one or more of the following options: Medicines. Injections of a  substance called botulinum toxin. Procedures such as deep brain stimulation (DBS) implantation or MRI-guided ultrasound treatment. Lifestyle changes. Occupational or physical therapy. Follow these instructions at home: Lifestyle  Do not use any products that contain nicotine or tobacco. These products include cigarettes, chewing tobacco, and vaping devices, such as e-cigarettes. If you need help quitting, ask your health care provider. Limit your caffeine intake as told by your health care provider. Try to get 8 hours of sleep each night. Find ways to manage your stress that fit your lifestyle and personality. Consider trying meditation or yoga. Try to anticipate stressful situations and allow extra time to manage them. If you are struggling emotionally with the effects of your tremor, consider working with a mental health provider. General instructions Take over-the-counter and prescription medicines only as told by your health care provider. Avoid extreme heat and extreme cold. Keep all follow-up visits. This is important. Visits may include physical therapy visits. Where to find more information [employer] of Neurological Disorders and Stroke: [employer] Contact a health care provider if: You experience any changes in the location or intensity of your tremors. You start having a tremor after starting a new medicine. You have a tremor with other symptoms, such as: Numbness. Tingling. Pain. Weakness. Your tremor gets worse. Your tremor interferes with your daily life. You feel down, blue, or sad for at least 2 weeks in a row. Worrying about your tremor and what other people think about you interferes with  your everyday life functions, including relationships, work, or school. Summary Essential tremor is a tremor without a known cause. Usually, it occurs when you are trying to perform an action. You are more likely to develop this condition if you have a family member  with essential tremor. The main sign of a tremor is a rhythmic shaking of certain parts of your body that is uncontrolled and unintentional. Treatment for essential tremor depends on the severity of the condition. This information is not intended to replace advice given to you by your health care provider. Make sure you discuss any questions you have with your health care provider. Document Revised: 08/25/2021 Document Reviewed: 08/25/2021 Elsevier Patient Education  2024 [employer].

## 2024-05-25 ENCOUNTER — Telehealth: Payer: Self-pay | Admitting: Neurology

## 2024-05-25 NOTE — Telephone Encounter (Signed)
 Patient states on the VM that he needs to speak with someone about a medication problem   He did not leave the name of the medication

## 2024-05-25 NOTE — Telephone Encounter (Signed)
 Primidone  was the medication. Patient was having a non existing  sex life and his entire body was relaxed and he was unable to be sexual active with his wife. Patient has D/C the medication

## 2024-11-27 ENCOUNTER — Ambulatory Visit: Admitting: Neurology
